# Patient Record
Sex: Female | Born: 1945 | Race: White | Hispanic: No | Marital: Single | Smoking: Current every day smoker
Health system: Southern US, Community
[De-identification: ages and names within clinical notes are randomized; demographics above are authoritative.]

## PROBLEM LIST (undated history)

## (undated) DIAGNOSIS — E785 Hyperlipidemia, unspecified: Secondary | ICD-10-CM

## (undated) DIAGNOSIS — J449 Chronic obstructive pulmonary disease, unspecified: Secondary | ICD-10-CM

## (undated) DIAGNOSIS — I219 Acute myocardial infarction, unspecified: Secondary | ICD-10-CM

## (undated) DIAGNOSIS — E559 Vitamin D deficiency, unspecified: Secondary | ICD-10-CM

## (undated) DIAGNOSIS — M1990 Unspecified osteoarthritis, unspecified site: Secondary | ICD-10-CM

## (undated) DIAGNOSIS — R5383 Other fatigue: Secondary | ICD-10-CM

## (undated) DIAGNOSIS — G459 Transient cerebral ischemic attack, unspecified: Secondary | ICD-10-CM

## (undated) DIAGNOSIS — R7303 Prediabetes: Secondary | ICD-10-CM

## (undated) DIAGNOSIS — I10 Essential (primary) hypertension: Secondary | ICD-10-CM

## (undated) DIAGNOSIS — I639 Cerebral infarction, unspecified: Secondary | ICD-10-CM

## (undated) DIAGNOSIS — E039 Hypothyroidism, unspecified: Secondary | ICD-10-CM

## (undated) HISTORY — DX: Hypothyroidism, unspecified: E03.9

## (undated) HISTORY — DX: Prediabetes: R73.03

## (undated) HISTORY — DX: Vitamin D deficiency, unspecified: E55.9

## (undated) HISTORY — DX: Unspecified osteoarthritis, unspecified site: M19.90

## (undated) HISTORY — DX: Chronic obstructive pulmonary disease, unspecified: J44.9

## (undated) HISTORY — DX: Essential (primary) hypertension: I10

## (undated) HISTORY — DX: Cerebral infarction, unspecified: I63.9

## (undated) HISTORY — DX: Hyperlipidemia, unspecified: E78.5

## (undated) HISTORY — DX: Transient cerebral ischemic attack, unspecified: G45.9

## (undated) HISTORY — DX: Other fatigue: R53.83

## (undated) HISTORY — DX: Acute myocardial infarction, unspecified: I21.9

---

## 2019-10-02 HISTORY — PX: OTHER SURGICAL HISTORY: SHX169

## 2019-11-06 ENCOUNTER — Encounter: Payer: Self-pay | Admitting: Neurology

## 2019-11-06 ENCOUNTER — Ambulatory Visit: Payer: Medicare PPO | Admitting: Neurology

## 2019-11-06 VITALS — BP 135/85 | HR 105 | Ht 62.0 in | Wt 92.0 lb

## 2019-11-06 DIAGNOSIS — G4452 New daily persistent headache (NDPH): Secondary | ICD-10-CM

## 2019-11-06 DIAGNOSIS — G9600 Cerebrospinal fluid leak, unspecified: Secondary | ICD-10-CM

## 2019-11-06 DIAGNOSIS — M5412 Radiculopathy, cervical region: Secondary | ICD-10-CM

## 2019-11-06 DIAGNOSIS — G9602 Spinal cerebrospinal fluid leak, spontaneous: Secondary | ICD-10-CM

## 2019-11-06 DIAGNOSIS — M542 Cervicalgia: Secondary | ICD-10-CM | POA: Insufficient documentation

## 2019-11-06 DIAGNOSIS — M7918 Myalgia, other site: Secondary | ICD-10-CM | POA: Diagnosis not present

## 2019-11-06 DIAGNOSIS — R1310 Dysphagia, unspecified: Secondary | ICD-10-CM

## 2019-11-06 DIAGNOSIS — G8929 Other chronic pain: Secondary | ICD-10-CM | POA: Insufficient documentation

## 2019-11-06 DIAGNOSIS — M5481 Occipital neuralgia: Secondary | ICD-10-CM | POA: Diagnosis not present

## 2019-11-06 DIAGNOSIS — R51 Headache with orthostatic component, not elsewhere classified: Secondary | ICD-10-CM

## 2019-11-06 MED ORDER — GABAPENTIN 100 MG PO CAPS: 100.0000 mg | ORAL_CAPSULE | Freq: Three times a day (TID) | ORAL | 6 refills | Status: DC

## 2019-11-06 NOTE — Progress Notes (Signed)
[facility]    Provider:  Dr Jaynee Eagles Requesting Provider: Art Buff, [place] Primary Care Provider:  Donald Prose, MD  CC:  headache  HPI:  Hailey Moody is a 74 y.o. female here as requested by Art Buff, [place] for headache. PMHx "mini stroke", hypohtyroidism, HLD, heart attack, fatigue, HTN, COPD, borderline diabetes, arthritis. She is here with her daughter. She has had a temporal artery biopsy. Started May 23rd, she started with a slight headache and she never get a headache no history of migraines or headaches. Since then she started having worsening headaches, 2 days later her headache was so bad she had to go to the clinic and she was in severe pain, positional quality better when laying down and worse throughout the day. Not resolved when laying down. It has never gone away completely. It feels like and electrode, shoots up to the top of the head and starts from the occipital area and shoot up. Pounding, pulsating, throbbing, continuous 24x7, waxes and wanes, she has a lot of neck pain and soreness in her shoulders. Also having significant neck and right shoulder pain. She was seen by rheumatology and had an evaluation. Some light sensitivity but only when the headache is severe, no inciting events, no falls, no new medications before it started. Prior to this very active. Daughter proides much information as well, she was quite well prior to this one day. Steroids did not help.  Headaches reported as severe and worsening.  Patient also reports difficulty swallowing, dysphagia, needs a swallow study.  No other focal neurologic deficits, associated symptoms, inciting events or modifiable factors.  Reviewed notes, labs and imaging from outside physicians, which showed:  I reviewed over 100 pages of records. Pertinent findings include: Patient was seen by her primary care physician and I reviewed those records: Who referred her to rheumatology, DEXA scan showed osteoporosis  she was started on Fosamax, chronic conditions include hypothyroidism, essential hypertension, hyperlipidemia, fatigue and vitamin D deficiency and new daily persistent headache patient reported acute onset daily headache, patient had a biopsy of the left temporal artery, biopsy was negative, patient continued to have daily headaches, she had been to the emergency room due to headache and elevated blood pressure, patient had multiple CTs of the head which were normal, had ultrasound of the arteries in the neck that she reported was 69% blocked, headaches reported as severe.  Per her primary care, blood pressure seems to be fairly well controlled most of the time, patient also had a complete cardiac work-up and she was also sent back to cardiology for evaluation, she does have chronic right shoulder pain and osteoarthritis.  I reviewed CT of the head report (we will request CD): September 11, 2019: No acute intracerebral hemorrhage, subarachnoid bleed or subdural hematoma noted, overall appearance and essentially stable and unchanged accounting for technical variance from a prior MRI, there is at best minimal central atrophic changes.  I reviewed records from [facility] in [place]:  Bone densitometry: October 21, 2019, BMD of the total lumbar spine, L1-L4 measures 0.644 g/cm with a T score of -3.7 and a D Z score of -1.2 considered within the range of osteoporosis.  BMD of the left femoral neck measures 0.520 g/cm with a T score of -3 and a Z score of -0.9 this is considered the range of osteoporosis.  Increased risk of fracture.  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  They recommended local wound care to left temporal incision  site, frontal lesion removed appears to be a possible basal cell cancer lesion.  Reviewed rheumatologic testing that was normal including antidouble-stranded DNA IgG, anti-Smith IgG, CD4 erythrocyte bound and B lymphocyte bound, ANA IgG, SSA, SSB, SCL 70, centromere antibodies IgG, Jo 1 antibodies, CCP antibodies, RF, IgM, RF IgA, CCP, IgG, anticardiolipin IgM and IgG, beta-2 glycoprotein antibodies IgM and IgG, antithyroglobulin IgG.  She had CRP elevation and ERC elevation, CK normal, hepatitis C core antibodies negative, hepatitis B surface antigen negative, hepatitis total core antibody is negative, TB Gold negative, normal vitamin D.  She did have a positive ANA antibody 01/15/2019 but notes state rheumatology evaluation at a later date ANA negative (see above).  Noted +Bouchard nodes, +Heberden's nodes, stiffness with active range of motion in the shoulders.  She noted little benefit with 14 days of steroids 40m.  She did have positive antithyroid peroxidase IgG.   I reviewed the lab results provided and documented them here: ESR 81, CRP 50, CK 52, CMP unremarkable, CBC unremarkable, RF less than 10 vitamin D 25-hydroxy 56.7, CCP antibodies normal, antidouble-stranded DNA IgG 27 within normal limits, anti-Smith IgG less than 1 within normal limits, erythrocyte bound CD4 and beta lymphocyte bound CD4 both within normal limits, ANA IgG +20 units, following lab tests normal: Anti-SSB, anti-SCL 70, anticentromere protein B, anti-Jo 1, anti-CCP, ANA titer was 1-3 20 which was positive and speckled, antiphospholipid syndrome and auto antibodies were all within normal limits, antithyroglobulin IgG was positive at 105, antithyroid peroxidase IgG was within normal limits, antihistone IgG was normal.  The following labs are all within normal limits anti-C1q IgG, antiribosomal IgG, antiphosphatidyl prothrombin IgM and IgG, anticardiolipin IgM/IgG/IgA, antibeta-2 glycoprotein IgM/IgG/IgA.  (collected September 09, 2019)   Review of Systems: Patient complains of symptoms per HPI as well as the following symptoms: headache, chronic neck and back pain. Pertinent negatives and positives per HPI. All others negative.   Social History   Socioeconomic History  . Marital status: Single    Spouse name: Not on file  . Number of children: Not on file  . Years of education: Not on file  . Highest education level: Not on file  Occupational History  . Not on file  Tobacco Use  . Smoking status: Current Every Day Smoker    Packs/day: 0.50    Types: Cigarettes  . Smokeless tobacco: Never Used  Substance and Sexual Activity  . Alcohol use: Not Currently  . Drug use: Never  . Sexual activity: Not on file  Other Topics Concern  . Not on file  Social History Narrative   [place] daughter due to medical reasons   Right handed   Caffeine: about 2 cups/day lately, normally 5.   Social Determinants of Health   Financial Resource Strain:   . Difficulty of Paying Living Expenses:   Food Insecurity:   . Worried About RCharity fundraiserin the Last Year:   . RArboriculturistin the Last Year:   Transportation Needs:   . LFilm/video editor(Medical):   .Marland KitchenLack of Transportation (Non-Medical):   Physical Activity:   . Days of Exercise per Week:   . Minutes of  Exercise per Session:   Stress:   . Feeling of Stress :   Social Connections:   . Frequency of Communication with Friends and Family:   . Frequency of Social Gatherings with Friends and Family:   . Attends Religious Services:   . Active Member of Clubs or Organizations:   . Attends Archivist Meetings:   Marland Kitchen Marital Status:   Intimate Partner Violence:   . Fear of Current or Ex-Partner:   . Emotionally Abused:   Marland Kitchen Physically Abused:   . Sexually Abused:     Family History  Problem Relation Age of Onset  . Diabetes type I Child   . Stroke Brother   . Heart attack  Mother   . Heart attack Father     Past Medical History:  Diagnosis Date  . Arthritis   . Borderline diabetes   . COPD (chronic obstructive pulmonary disease) (HCC)    on spiriva, nebulizer  . Essential hypertension   . Fatigue    unspecified  . Heart attack ([place])    "mild"  . Hyperlipidemia   . Hypothyroidism   . Mini stroke ([place])   . Vitamin D deficiency     Patient Active Problem List   Diagnosis Date Noted  . New daily persistent headache 11/06/2019  . Dysphagia 11/06/2019  . Cervical radiculopathy 11/06/2019  . Cervico-occipital neuralgia 11/06/2019  . Cervicalgia 11/06/2019  . Chronic neck pain 11/06/2019    Past Surgical History:  Procedure Laterality Date  . temporal artery biopsy Left 10/02/2019    Current Outpatient Medications  Medication Sig Dispense Refill  . albuterol (ACCUNEB) 1.25 MG/3ML nebulizer solution Take 3 mLs by nebulization. Three times per day as needed.    . Albuterol Sulfate (PROAIR HFA IN) Inhale 2 puffs into the lungs. Every 4-6 hours as needed.    Marland Kitchen amLODipine (NORVASC) 5 MG tablet Take 5 mg by mouth daily as needed.    . Calcium Carbonate-Vitamin D3 (CALCIUM 600/VITAMIN D) 600-400 MG-UNIT TABS Take 1 tablet by mouth in the morning and at bedtime.    . ergocalciferol (VITAMIN D2) 1.25 MG (50000 UT) capsule Take 50,000 Units by mouth once a week. On Tuesdays    . HYDROcodone-acetaminophen (NORCO/VICODIN) 5-325 MG tablet Take 1 tablet by mouth every 8 (eight) hours.     Marland Kitchen levothyroxine (SYNTHROID) 125 MCG tablet Take 125 mcg by mouth daily.    Marland Kitchen losartan (COZAAR) 100 MG tablet Take 100 mg by mouth daily.    . metoprolol tartrate (LOPRESSOR) 25 MG tablet Take 25 mg by mouth daily.    Marland Kitchen omeprazole (PRILOSEC) 20 MG capsule Take 20 mg by mouth daily.    . sertraline (ZOLOFT) 100 MG tablet Take 100 mg by mouth daily.     Marland Kitchen SPIRIVA HANDIHALER 18 MCG inhalation capsule Place 1 capsule into inhaler and inhale daily.    Marland Kitchen gabapentin (NEURONTIN)  100 MG capsule Take 1 capsule (100 mg total) by mouth 3 (three) times daily. 90 capsule 6   No current facility-administered medications for this visit.    Allergies as of 11/06/2019 - Review Complete 11/06/2019  Allergen Reaction Noted  . Penicillins  11/06/2019    Vitals: BP 135/85 (BP Location: Left Arm, Patient Position: Sitting, Cuff Size: Small)   Pulse (!) 105   Ht 5' 2"  (1.575 m)   Wt 92 lb (41.7 kg)   BMI 16.83 kg/m  Last Weight:  Wt Readings from Last 1 Encounters:  11/06/19 92 lb (41.7  kg)   Last Height:   Ht Readings from Last 1 Encounters:  11/06/19 5' 2"  (1.575 m)     Physical exam: Exam: Gen: NAD, conversant, thin and frail                   CV: RRR, no MRG. No Carotid Bruits. No peripheral edema, warm, nontender Eyes: Conjunctivae clear without exudates or hemorrhage  Neuro: Detailed Neurologic Exam  Speech:    Speech is normal; fluent and spontaneous with normal comprehension.  Cognition:    The patient is oriented to person, place, and time;     recent and remote memory intact;     language fluent;     normal attention, concentration,     fund of knowledge Cranial Nerves:    The pupils are equal, round, and reactive to light.attempted fundoscpy could not visualize due to small pupils. Visual fields are full to finger confrontation. Extraocular movements are intact. Trigeminal sensation is intact and the muscles of mastication are normal. The face is symmetric. The palate elevates in the midline. Hearing intact. Voice is normal. Shoulder shrug is normal. The tongue has normal motion without fasciculations.   Coordination:    No ataxia or dysmetria  Gait:    Normal native gait  Motor Observation:    No asymmetry, no atrophy, and no involuntary movements noted. Tone:    Normal muscle tone.    Posture:    Posture is normal. normal erect    Strength: Limited by pain in joints however no focal deficits noted, V/V in the upper and lower limbs.       Sensation: intact to LT     Reflex Exam:  DTR's:    Deep tendon reflexes in the upper and lower extremities are brisk bilaterally.   Toes:    The toes are downgoing bilaterally.   Clonus:    Clonus is absent.    Assessment/Plan:  Very interesting case of a 74 year old female with no history of headaches with new onset daly persistent headache. In situations such as this especially since she has a positional quality we worry about spontaneous CSF leak and she does have many of those symptoms including positional quality, neck pain and soreness. She had a temporal artery biopsy and steroids did not help so that makes GCA or PMR very unlikely. She has neck pain and quality is radiating from the occipital area so this may be occipital nerve irritation/cervicalgia.  An occipital block helped tremendously with neck pain and improved the headache. This makes cervicogenic headache most likely. However given acute onset and positional quality with continuous pain for months we really need to evaluate for spontaneous csf leak and low csf pressure headache. Spent extended time with patient and daughter as well as reviewing approximately 100 pages of records.   - Need MRI brain and cervical spine for csf leak, headache, chronic neck pain, dysphagia, cervico-occipital pain, cervical radiculopathy, new daily persistent refractory headache, positional headache. - May consider a blood patch and referral to [facility] if needed - Blood work today - Bilateral occipital nerve blocks - Physical therapy with dry needling for occipital neuralgia/cervicalgia: Physical therapy and also Please include dry needling cervcal spine for cervico-occipital neuralgia and cervicalgia as well as for myofascial pain syndrome.  She also has right shoulder pain and hopefully we can incorporate this into physical therapy as well. - Dysphagia hoarse voice, feeling choked: Swallow study and MRI of the cervical spine - Gabapentin  prn  Performed by Dr. Jaynee Eagles M.D. 30-gauge needle was used. All procedures a documented below were medically necessary, reasonable and appropriate based on the patient's history, medical diagnosis and physician opinion. Verbal informed consent was obtained from the patient, patient was informed of potential risk of procedure, including bruising, bleeding, hematoma formation, infection, muscle weakness, muscle pain, numbness, transient hypertension, transient hyperglycemia and transient insomnia among others. All areas injected were topically clean with isopropyl rubbing alcohol. Nonsterile nonlatex gloves were worn during the procedure.  1. Greater occipital nerve block [phone]). The greater occipital nerve site was identified at the nuchal line medial to the occipital artery. Medication was injected into the left and right occipital nerve areas and suboccipital areas. Patient's condition is associated with inflammation of the greater occipital nerve and associated multiple groups. Injection was deemed medically necessary, reasonable and appropriate. Injection represents a separate and unique surgical service.   Orders Placed This Encounter  Procedures  . MR BRAIN W WO CONTRAST  . MR CERVICAL SPINE W WO CONTRAST  . DG OP Swallowing Func-Medicare/Speech Path  . Sedimentation rate  . C-reactive protein  . TSH  . Comprehensive metabolic panel  . CBC  . T4, Free  . Ambulatory referral to Physical Therapy  . SLP modified barium swallow   Meds ordered this encounter  Medications  . gabapentin (NEURONTIN) 100 MG capsule    Sig: Take 1 capsule (100 mg total) by mouth 3 (three) times daily.    Dispense:  90 capsule    Refill:  6    Cc: Art Buff, FNP,  Donald Prose, MD  Sarina Ill, MD  [facility] [address] [place], [place] [address]  Phone [phone] Fax [phone]  I spent more than 90 minutes of face-to-face and non-face-to-face time  with patient on the  1. New daily persistent headache   2. Chronic neck pain   3. Myofascial pain syndrome, cervical   4. Cervicalgia   5. Cervico-occipital neuralgia   6. Cerebrospinal fluid leak   7. CSF leak   8. 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 Cervical radiculopathy   10. Positional headache   11. Headache due to low cerebrospinal fluid pressure   12. NDPH (new daily persistent headache)   13. Dysphagia, unspecified type    diagnosis.  This included previsit chart review, lab review, study review, order entry, electronic health record documentation, patient education on the different diagnostic and therapeutic options, counseling and coordination of care, risks and benefits of management, compliance, or risk factor reduction. This does not include time spent on nerve block procedure.

## 2019-11-06 NOTE — Progress Notes (Signed)
Nerve block w/o steroid: Pt signed consent  0.5% Bupivocaine 3 mL LOT: [id number] EXP: 03/2022 NDC: 12244-975-30  2% Lidocaine 3 mL LOT: [id number] EXP: 02/26/20 NDC: 0511-0211-17

## 2019-11-06 NOTE — Patient Instructions (Addendum)
MRI of the brain and cervical spine Blood work today Gabapentin up to 3x daily  Gabapentin capsules or tablets What is this medicine? GABAPENTIN (GA ba pen tin) is used to control seizures in certain types of epilepsy. It is also used to treat certain types of nerve pain. This medicine may be used for other purposes; ask your health care provider or pharmacist if you have questions. COMMON BRAND NAME(S): Active-PAC with Gabapentin, Gabarone, Neurontin What should I tell my health care provider before I take this medicine? They need to know if you have any of these conditions:  history of drug abuse or alcohol abuse problem  kidney disease  lung or breathing disease  suicidal thoughts, plans, or attempt; a previous suicide attempt by you or a family member  an unusual or allergic reaction to gabapentin, other medicines, foods, dyes, or preservatives  pregnant or trying to get pregnant  breast-feeding How should I use this medicine? Take this medicine by mouth with a glass of water. Follow the directions on the prescription label. You can take it with or without food. If it upsets your stomach, take it with food. Take your medicine at regular intervals. Do not take it more often than directed. Do not stop taking except on your doctor's advice. If you are directed to break the 600 or 800 mg tablets in half as part of your dose, the extra half tablet should be used for the next dose. If you have not used the extra half tablet within 28 days, it should be thrown away. A special MedGuide will be given to you by the pharmacist with each prescription and refill. Be sure to read this information carefully each time. Talk to your pediatrician regarding the use of this medicine in children. While this drug may be prescribed for children as young as 3 years for selected conditions, precautions do apply. Overdosage: If you think you have taken too much of this medicine contact a poison control  center or emergency room at once. NOTE: This medicine is only for you. Do not share this medicine with others. What if I miss a dose? If you miss a dose, take it as soon as you can. If it is almost time for your next dose, take only that dose. Do not take double or extra doses. What may interact with this medicine? This medicine may interact with the following medications:  alcohol  antihistamines for allergy, cough, and cold  certain medicines for anxiety or sleep  certain medicines for depression like amitriptyline, fluoxetine, sertraline  certain medicines for seizures like phenobarbital, primidone  certain medicines for stomach problems  general anesthetics like halothane, isoflurane, methoxyflurane, propofol  local anesthetics like lidocaine, pramoxine, tetracaine  medicines that relax muscles for surgery  narcotic medicines for pain  phenothiazines like chlorpromazine, mesoridazine, prochlorperazine, thioridazine This list may not describe all possible interactions. Give your health care provider a list of all the medicines, herbs, non-prescription drugs, or dietary supplements you use. Also tell them if you smoke, drink alcohol, or use illegal drugs. Some items may interact with your medicine. What should I watch for while using this medicine? Visit your doctor or health care provider for regular checks on your progress. You may want to keep a record at home of how you feel your condition is responding to treatment. You may want to share this information with your doctor or health care provider at each visit. You should contact your doctor or health care provider if your  seizures get worse or if you have any new types of seizures. Do not stop taking this medicine or any of your seizure medicines unless instructed by your doctor or health care provider. Stopping your medicine suddenly can increase your seizures or their severity. This medicine may cause serious skin reactions.  They can happen weeks to months after starting the medicine. Contact your health care provider right away if you notice fevers or flu-like symptoms with a rash. The rash may be red or purple and then turn into blisters or peeling of the skin. Or, you might notice a red rash with swelling of the face, lips or lymph nodes in your neck or under your arms. Wear a medical identification bracelet or chain if you are taking this medicine for seizures, and carry a card that lists all your medications. You may get drowsy, dizzy, or have blurred vision. Do not drive, use machinery, or do anything that needs mental alertness until you know how this medicine affects you. To reduce dizzy or fainting spells, do not sit or stand up quickly, especially if you are an older patient. Alcohol can increase drowsiness and dizziness. Avoid alcoholic drinks. Your mouth may get dry. Chewing sugarless gum or sucking hard candy, and drinking plenty of water will help. The use of this medicine may increase the chance of suicidal thoughts or actions. Pay special attention to how you are responding while on this medicine. Any worsening of mood, or thoughts of suicide or dying should be reported to your health care provider right away. Women who become pregnant while using this medicine may enroll in the [place] [employer] Antiepileptic Drug Pregnancy Registry by calling [phone]. This registry collects information about the safety of antiepileptic drug use during pregnancy. What side effects may I notice from receiving this medicine? Side effects that you should report to your doctor or health care professional as soon as possible:  allergic reactions like skin rash, itching or hives, swelling of the face, lips, or tongue  breathing problems  rash, fever, and swollen lymph nodes  redness, blistering, peeling or loosening of the skin, including inside the mouth  suicidal thoughts, mood changes Side effects that usually do not  require medical attention (report to your doctor or health care professional if they continue or are bothersome):  dizziness  drowsiness  headache  nausea, vomiting  swelling of ankles, feet, hands  tiredness This list may not describe all possible side effects. Call your doctor for medical advice about side effects. You may report side effects to FDA at 1-800-FDA-1088. Where should I keep my medicine? Keep out of reach of children. This medicine may cause accidental overdose and death if it taken by other adults, children, or pets. Mix any unused medicine with a substance like cat litter or coffee grounds. Then throw the medicine away in a sealed container like a sealed bag or a coffee can with a lid. Do not use the medicine after the expiration date. Store at room temperature between 15 and 30 degrees C (59 and 86 degrees F). NOTE: This sheet is a summary. It may not cover all possible information. If you have questions about this medicine, talk to your doctor, pharmacist, or health care provider.  2020 Elsevier/Gold Standard (2018-06-15 14:16:43)    Occipital Neuralgia  Occipital neuralgia is a type of headache that causes brief episodes of very bad pain in the back of your head. Pain from occipital neuralgia may spread (radiate) to other parts of your head.  These headaches may be caused by irritation of the nerves that leave your spinal cord high up in your neck, just below the base of your skull (occipital nerves). Your occipital nerves transmit sensations from the back of your head, the top of your head, and the areas behind your ears. What are the causes? This condition can occur without any known cause (primary headache syndrome). In other cases, this condition is caused by pressure on or irritation of one of the two occipital nerves. Pressure and irritation may be due to:  Muscle spasm in the neck.  Neck injury.  Wear and tear of the vertebrae in the neck  (osteoarthritis).  Disease of the disks that separate the vertebrae.  Swollen blood vessels that put pressure on the occipital nerves.  Infections.  Tumors.  Diabetes. What are the signs or symptoms? This condition causes brief burning, stabbing, electric, shocking, or shooting pain which can radiate to the top of the head. It can happen on one side or both sides of the head. It can also cause:  Pain behind the eye.  Pain triggered by neck movement or hair brushing.  Scalp tenderness.  Aching in the back of the head between episodes of very bad pain.  Pain gets worse with exposure to bright lights. How is this diagnosed? There is no test that diagnoses this condition. Your health care provider may diagnose this condition based on a physical exam and your symptoms. Other tests may be done, such as:  Imaging studies of the brain and neck (cervical spine), such as an MRI or CT scan. These look for causes of pinched nerves.  Applying pressure to the nerves in the neck to try to re-create the pain.  Injection of numbing medicine into the occipital nerve areas to see if pain goes away (diagnostic nerve block). How is this treated? Treatment for this condition may begin with simple measures, such as:  Rest.  Massage.  Applying heat or cold on the area.  Over-the-counter pain relievers. If these measures do not work, you may need other treatments, including:  Medicines, such as: ? Prescription-strength anti-inflammatory medicines. ? Muscle relaxants. ? Anti-seizure medicines, which can relieve pain. ? Antidepressants, which can relieve pain. ? Injected medicines, such as medicines that numb the area (local anesthetic) and steroids.  Pulsed radiofrequency ablation. This is when wires are implanted to deliver electrical impulses that block pain signals from the occipital nerve.  Surgery to relieve nerve pressure.  Physical therapy. Follow these instructions at home: Pain  management      Avoid any activities that cause pain.  Rest when you have an attack of pain.  Try gentle massage to relieve pain.  Try a different pillow or sleeping position.  If directed, apply heat to the affected area as told by your health care provider. Use the heat source that your health care provider recommends, such as a moist heat pack or a heating pad. ? Place a towel between your skin and the heat source. ? Leave the heat on for 20-30 minutes. ? Remove the heat if your skin turns bright red. This is especially important if you are unable to feel pain, heat, or cold. You may have a greater risk of getting burned.  If directed, apply ice to the back of the head and neck area as told by your health care provider. ? Put ice in a plastic bag. ? Place a towel between your skin and the bag. ? Leave the ice on  for 20 minutes, 2-3 times per day. General instructions  Take over-the-counter and prescription medicines only as told by your health care provider.  Avoid things that make your symptoms worse, such as bright lights.  Try to stay active. Get regular exercise that does not cause pain. Ask your health care provider to suggest safe exercises for you.  Work with a physical therapist to learn stretching exercises you can do at home.  Practice good posture.  Keep all follow-up visits as told by your health care provider. This is important. Contact a health care provider if:  Your medicine is not working.  You have new or worsening symptoms. Get help right away if:  You have very bad head pain that does not go away.  You have a sudden change in vision, balance, or speech. Summary  Occipital neuralgia is a type of headache that causes brief episodes of very bad pain in the back of your head.  Pain from occipital neuralgia may spread (radiate) to other parts of your head.  Treatment for this condition includes rest, massage, and medicines. This information is not  intended to replace advice given to you by your health care provider. Make sure you discuss any questions you have with your health care provider. Document Revised: 02/28/2017 Document Reviewed: 05/19/2016 Elsevier Patient Education  2020 Elsevier Inc.   Spinal Headache or csf leak headache  A spinal headache is a severe headache that can happen after a person has had a lumbar puncture or epidural anesthesia or can happen spontaneously. During these procedures, a needle is passed between the bones of the spine. The headache usually starts from a few hours to 1-2 days after that. The headache can last for a few days. In rare cases, it can last for more than a week. If it happens spontaneously is called spontaneous csf leak or low pressure headache.  What are the causes? This condition is caused by a leak of spinal fluid from the spine through the hole that is left by the needle. What are the signs or symptoms? Symptoms of this condition include:  A severe headache.  A headache that is worse when you sit or stand and better when you lie down.  Neck pain and stiffness, especially when tilting your chin toward your chest.  Nausea and vomiting. How is this diagnosed? This condition is usually diagnosed based on:  Your medical history.  Your symptoms.  A CT scan or MRI of the brain to help rule out other conditions. How is this treated? Treatment for this condition may include:  Replacing fluids that leaked out through the needle hole. Fluids may be replaced by: ? Drinking more fluids. ? Getting fluids through an IV line that is inserted into one of your veins.  Caffeine to help reduce your headache. Your health care provider may recommend drinking caffeinated beverages such as soda, coffee, or tea.  Having an epidural blood patch procedure. In this procedure, a small amount of your blood is injected into the area of the leak in order to seal it.  Medicines for pain.  Resting and  lying flat for a few days. Follow these instructions at home:      Take over-the-counter and prescription medicines only as told by your health care provider.  Drink enough fluids to keep your urine pale yellow.  Drink caffeinated beverages as told by your health care provider.  Lie down to relieve pain if your pain gets worse when you sit or stand.  Return to your normal activities as told by your health care provider. Ask your health care provider what activities are safe for you.  Keep all follow-up visits as told by your health care provider. This is important. Contact a health care provider if:  You develop nausea and vomiting. Get help right away if:  Your pain becomes very severe.  Your pain cannot be controlled.  You develop a fever.  You have a stiff neck.  You develop problems with your vision.  You lose control of your bowel or bladder (have incontinence).  You have trouble walking, you feel weak, or you lose feeling in part of your body. Summary  A spinal headache is a severe headache that can happen after a person has had a lumbar puncture or epidural anesthesia.  This condition is caused by a leak of spinal fluid from the spine through the hole that is left by the needle. The headache can last for a few days. In rare cases, it lasts for more than a week.  Supportive measures, such as drinking more fluid and taking pain medicines, are usually recommended. In some cases, it may be necessary to inject a small amount of your blood to seal the leak. This information is not intended to replace advice given to you by your health care provider. Make sure you discuss any questions you have with your health care provider. Document Revised: 04/27/2017 Document Reviewed: 04/26/2017 Elsevier Patient Education  2020 Elsevier Inc. Occipital Nerve Block Patient Information  Description: The occipital nerves originate in the cervical (neck) spinal cord and travel upward  through muscle and tissue to supply sensation to the back of the head and top of the scalp.  In addition, the nerves control some of the muscles of the scalp.  Occipital neuralgia is an irritation of these nerves which can cause headaches, numbness of the scalp, and neck discomfort.     The occipital nerve block will interrupt nerve transmission through these nerves and can relieve pain and spasm.  The block consists of insertion of a small needle under the skin in the back of the head to deposit local anesthetic (numbing medicine) and/or steroids around the nerve.  The entire block usually lasts less than 5 minutes.  Conditions which may be treated by occipital blocks:   Muscular pain and spasm of the scalp  Nerve irritation, back of the head  Headaches  Upper neck pain  Preparation for the injection:  6. Do not eat any solid food or dairy products within 8 hours of your appointment. 7. You may drink clear liquids up to 3 hours before appointment.  Clear liquids include water, black coffee, juice or soda.  No milk or cream please. 8. You may take your regular medication, including pain medications, with a sip of water before you appointment.  Diabetics should hold regular insulin (if taken separately) and take 1/2 normal NPH dose the morning of the procedure.  Carry some sugar containing items with you to your appointment. 9. A driver must accompany you and be prepared to drive you home after your procedure. 10. Bring all your current medications with you. 11. An IV may be inserted and sedation may be given at the discretion of the physician. 12. A blood pressure cuff, EKG, and other monitors will often be applied during the procedure.  Some patients may need to have extra oxygen administered for a short period. 13. You will be asked to provide medical information, including your allergies and  medications, prior to the procedure.  We must know immediately if you are taking blood thinners  (like Coumadin/Warfarin) or if you are allergic to IV iodine contrast (dye).  We must know if you could possible be pregnant.  14. Do not wear a high collared shirt or turtleneck.  Tie long hair up in the back if possible.  Possible side-effects:   Bleeding from needle site  Infection (rare, may require surgery)  Nerve injury (rare)  Hair on back of neck can be tinged with iodine scrub (this will wash out)  Light-headedness (temporary)  Pain at injection site (several days)  Decreased blood pressure (rare, temporary)  Seizure (very rare)  Call if you experience:   Hives or difficulty breathing ( go to the emergency room)  Inflammation or drainage at the injection site(s)  Please note:  Although the local anesthetic injected can often make your painful muscles or headache feel good for several hours after the injection, the pain may return.  It takes 3-7 days for steroids to work.  You may not notice any pain relief for at least one week.  If effective, we will often do a series of injections spaced 3-6 weeks apart to maximally decrease your pain.  If you have any questions, please call [phone] [facility]

## 2019-11-07 ENCOUNTER — Telehealth: Payer: Self-pay | Admitting: *Deleted

## 2019-11-07 ENCOUNTER — Telehealth: Payer: Self-pay | Admitting: Neurology

## 2019-11-07 LAB — COMPREHENSIVE METABOLIC PANEL
ALT: 11 IU/L (ref 0–32)
AST: 18 IU/L (ref 0–40)
Albumin/Globulin Ratio: 1.4 (ref 1.2–2.2)
Albumin: 4.1 g/dL (ref 3.7–4.7)
Alkaline Phosphatase: 124 IU/L — ABNORMAL HIGH (ref 48–121)
BUN/Creatinine Ratio: 18 (ref 12–28)
BUN: 16 mg/dL (ref 8–27)
Bilirubin Total: 0.4 mg/dL (ref 0.0–1.2)
CO2: 26 mmol/L (ref 20–29)
Calcium: 10.1 mg/dL (ref 8.7–10.3)
Chloride: 88 mmol/L — ABNORMAL LOW (ref 96–106)
Creatinine, Ser: 0.87 mg/dL (ref 0.57–1.00)
GFR calc Af Amer: 75 mL/min/{1.73_m2} (ref >59)
GFR calc non Af Amer: 65 mL/min/{1.73_m2} (ref >59)
Globulin, Total: 3 g/dL (ref 1.5–4.5)
Glucose: 87 mg/dL (ref 65–99)
Potassium: 4.3 mmol/L (ref 3.5–5.2)
Sodium: 129 mmol/L — ABNORMAL LOW (ref 134–144)
Total Protein: 7.1 g/dL (ref 6.0–8.5)

## 2019-11-07 LAB — T4, FREE: Free T4: 1.46 ng/dL (ref 0.82–1.77)

## 2019-11-07 LAB — TSH: TSH: 20.8 u[IU]/mL — ABNORMAL HIGH (ref 0.450–4.500)

## 2019-11-07 LAB — CBC
Hematocrit: 39 % (ref 34.0–46.6)
Hemoglobin: 13.2 g/dL (ref 11.1–15.9)
MCH: 28.4 pg (ref 26.6–33.0)
MCHC: 33.8 g/dL (ref 31.5–35.7)
MCV: 84 fL (ref 79–97)
Platelets: 486 10*3/uL — ABNORMAL HIGH (ref 150–450)
RBC: 4.65 x10E6/uL (ref 3.77–5.28)
RDW: 14.5 % (ref 11.7–15.4)
WBC: 9.5 10*3/uL (ref 3.4–10.8)

## 2019-11-07 LAB — C-REACTIVE PROTEIN: CRP: 145 mg/L — ABNORMAL HIGH (ref 0–10)

## 2019-11-07 LAB — SEDIMENTATION RATE: Sed Rate: 25 mm/hr (ref 0–40)

## 2019-11-07 NOTE — Telephone Encounter (Signed)
I spoke with the patient and discussed her lab results with her per Dr. Lucia Gaskins. Her questions were answered. She was advised to follow with PCP for thyroid and sodium level. Pt aware labs were sent to PCP. She verbalized appreciation for the call.

## 2019-11-07 NOTE — Telephone Encounter (Signed)
[employer] pending  

## 2019-11-07 NOTE — Progress Notes (Signed)
Free T4 value is normal. So it appears her thyroid medication is working but I would continue to follow  up with primary care. Her Sodium is very low at 129 but it was like that on prior labs too (09/09/2019 was also 129) I would monitor it with primary care and have them evaluate that. I have forwarded labs to Dr. Wynelle Link. thanks

## 2019-11-07 NOTE — Telephone Encounter (Signed)
-----   Message from Anson Fret, MD sent at 11/07/2019 10:37 AM EDT ----- Free T4 value is normal. So it appears her thyroid medication is working but I would continue to follow  up with primary care. Her Sodium is very low at 129 but it was like that on prior labs too (09/09/2019 was also 129) I would monitor it with primary care and have them evaluate that. I have forwarded labs to Dr. Wynelle Link. thanks

## 2019-11-11 ENCOUNTER — Telehealth: Payer: Self-pay | Admitting: Neurology

## 2019-11-11 NOTE — Telephone Encounter (Signed)
Francine Graven Berkley Harvey: [id number] (exp. 11/07/19 to 12/07/19) order sent to GI. They will reach out to the patient to schedule.

## 2019-11-11 NOTE — Telephone Encounter (Signed)
Pt's daughter, Milagros Evener ask if Pt can be worked in for an injection. Mr. Zachery Dauer said Dr. Lucia Gaskins told her at visit last week if she needed an injection to call early enough and would work Pt in. Ms. Zachery Dauer would like a call from the nurse.

## 2019-11-11 NOTE — Telephone Encounter (Signed)
Spoke with Dr Lucia Gaskins. We have an 8:00 AM availability tomorrow 11/12/19. Please offer this appointment, thank you!

## 2019-11-12 ENCOUNTER — Ambulatory Visit: Payer: Medicare PPO | Admitting: Neurology

## 2019-11-12 VITALS — BP 146/89 | HR 101

## 2019-11-12 DIAGNOSIS — M5412 Radiculopathy, cervical region: Secondary | ICD-10-CM | POA: Diagnosis not present

## 2019-11-12 DIAGNOSIS — R519 Headache, unspecified: Secondary | ICD-10-CM

## 2019-11-12 DIAGNOSIS — M5481 Occipital neuralgia: Secondary | ICD-10-CM | POA: Diagnosis not present

## 2019-11-12 MED ORDER — METHYLPREDNISOLONE ACETATE 80 MG/ML IJ SUSP
80.0000 mg | Freq: Once | INTRAMUSCULAR | Status: AC
  Administered 2019-11-12: 80 mg via INTRAMUSCULAR

## 2019-11-12 MED ORDER — GABAPENTIN 300 MG PO CAPS: 300.0000 mg | ORAL_CAPSULE | Freq: Three times a day (TID) | ORAL | 6 refills | Status: DC

## 2019-11-12 NOTE — Progress Notes (Signed)
Patient here for repeat nerve block, occipital. An occipital block last week helped tremendously with neck pain and improved the occipital headache. 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  Referral to Dr. Naaman Plummer for cervical ESI, medial branch blocks or occipital RFA as per his clinical evauation. Also increase gabapentin.  Performed by Dr. Lucia Gaskins M.D. . All procedures a documented blood were medically necessary, reasonable and appropriate based on the patient's history, medical diagnosis and physician opinion. Verbal informed consent was obtained from the patient, patient was informed of potential risk of procedure, including bruising, bleeding, hematoma formation, infection, muscle weakness, muscle pain, numbness, transient hypertension, transient hyperglycemia and transient insomnia among others. All areas injected were topically clean with isopropyl rubbing alcohol. Nonsterile nonlatex gloves were worn during the procedure.  1. Greater occipital nerve block [phone]). The greater occipital nerve site was identified at the nuchal line medial to the occipital artery. Medication was injected into the left and right occipital nerve areas and suboccipital areas. Patient's condition is associated with inflammation of the greater occipital nerve and associated multiple groups. Injection was deemed medically necessary, reasonable and appropriate. Injection represents a separate and unique surgical service.  1. Lesser occipital nerve block (64450). The lesser occipital nerve site was identified approximately 2 cm lateral to the greater occipital nerve. Occasion was injected into the left and right occipital nerve areas. Patient's condition is associated  with inflammation of the lesser occipital nerve and associated muscle groups. Injection was deemed medically necessary, reasonable and appropriate. Injection represents a separate and unique surgical service.  2. Supraorbital nerve block (64400): Supraorbital nerve site was identified along the incision of the frontal bone on the orbital/supraorbital ridge. Medication was injected into the left supraorbital nerve areas. Patient's condition is associated with inflammation of the supraorbital and associated muscle groups. Injection was deemed medically necessary, reasonable and appropriate. Injection represents a separate and unique surgical service.

## 2019-11-12 NOTE — Progress Notes (Signed)
Nerve block w/ steroid: Pt signed consent  0.5% Bupivocaine 4 mL LOT: [id number] EXP: 03/2022 NDC: 51700-174-94  2% Lidocaine 4 mL LOT: [id number] EXP: 02/26/20 NDC: 4967-5916-38  Depomedrol 80 mg/mL x 1  See MAR

## 2019-11-12 NOTE — Patient Instructions (Signed)
Dr. Naaman Plummer for further procedures MRI brain and cervical spine Repeat nerve block Friday   Occipital Neuralgia  Occipital neuralgia is a type of headache that causes brief episodes of very bad pain in the back of your head. Pain from occipital neuralgia may spread (radiate) to other parts of your head. These headaches may be caused by irritation of the nerves that leave your spinal cord high up in your neck, just below the base of your skull (occipital nerves). Your occipital nerves transmit sensations from the back of your head, the top of your head, and the areas behind your ears. What are the causes? This condition can occur without any known cause (primary headache syndrome). In other cases, this condition is caused by pressure on or irritation of one of the two occipital nerves. Pressure and irritation may be due to:  Muscle spasm in the neck.  Neck injury.  Wear and tear of the vertebrae in the neck (osteoarthritis).  Disease of the disks that separate the vertebrae.  Swollen blood vessels that put pressure on the occipital nerves.  Infections.  Tumors.  Diabetes. What are the signs or symptoms? This condition causes brief burning, stabbing, electric, shocking, or shooting pain which can radiate to the top of the head. It can happen on one side or both sides of the head. It can also cause:  Pain behind the eye.  Pain triggered by neck movement or hair brushing.  Scalp tenderness.  Aching in the back of the head between episodes of very bad pain.  Pain gets worse with exposure to bright lights. How is this diagnosed? There is no test that diagnoses this condition. Your health care provider may diagnose this condition based on a physical exam and your symptoms. Other tests may be done, such as:  Imaging studies of the brain and neck (cervical spine), such as an MRI or CT scan. These look for causes of pinched nerves.  Applying pressure to the nerves in the neck to  try to re-create the pain.  Injection of numbing medicine into the occipital nerve areas to see if pain goes away (diagnostic nerve block). How is this treated? Treatment for this condition may begin with simple measures, such as:  Rest.  Massage.  Applying heat or cold on the area.  Over-the-counter pain relievers. If these measures do not work, you may need other treatments, including:  Medicines, such as: ? Prescription-strength anti-inflammatory medicines. ? Muscle relaxants. ? Anti-seizure medicines, which can relieve pain. ? Antidepressants, which can relieve pain. ? Injected medicines, such as medicines that numb the area (local anesthetic) and steroids.  Pulsed radiofrequency ablation. This is when wires are implanted to deliver electrical impulses that block pain signals from the occipital nerve.  Surgery to relieve nerve pressure.  Physical therapy. Follow these instructions at home: Pain management      Avoid any activities that cause pain.  Rest when you have an attack of pain.  Try gentle massage to relieve pain.  Try a different pillow or sleeping position.  If directed, apply heat to the affected area as told by your health care provider. Use the heat source that your health care provider recommends, such as a moist heat pack or a heating pad. ? Place a towel between your skin and the heat source. ? Leave the heat on for 20-30 minutes. ? Remove the heat if your skin turns bright red. This is especially important if you are unable to feel pain, heat, or  cold. You may have a greater risk of getting burned.  If directed, apply ice to the back of the head and neck area as told by your health care provider. ? Put ice in a plastic bag. ? Place a towel between your skin and the bag. ? Leave the ice on for 20 minutes, 2-3 times per day. General instructions  Take over-the-counter and prescription medicines only as told by your health care provider.  Avoid  things that make your symptoms worse, such as bright lights.  Try to stay active. Get regular exercise that does not cause pain. Ask your health care provider to suggest safe exercises for you.  Work with a physical therapist to learn stretching exercises you can do at home.  Practice good posture.  Keep all follow-up visits as told by your health care provider. This is important. Contact a health care provider if:  Your medicine is not working.  You have new or worsening symptoms. Get help right away if:  You have very bad head pain that does not go away.  You have a sudden change in vision, balance, or speech. Summary  Occipital neuralgia is a type of headache that causes brief episodes of very bad pain in the back of your head.  Pain from occipital neuralgia may spread (radiate) to other parts of your head.  Treatment for this condition includes rest, massage, and medicines. This information is not intended to replace advice given to you by your health care provider. Make sure you discuss any questions you have with your health care provider. Document Revised: 02/28/2017 Document Reviewed: 05/19/2016 [employer] Patient Education  2020 [employer] Occipital Nerve Block Patient Information  Description: The occipital nerves originate in the cervical (neck) spinal cord and travel upward through muscle and tissue to supply sensation to the back of the head and top of the scalp.  In addition, the nerves control some of the muscles of the scalp.  Occipital neuralgia is an irritation of these nerves which can cause headaches, numbness of the scalp, and neck discomfort.     The occipital nerve block will interrupt nerve transmission through these nerves and can relieve pain and spasm.  The block consists of insertion of a small needle under the skin in the back of the head to deposit local anesthetic (numbing medicine) and/or steroids around the nerve.  The entire block usually lasts  less than 5 minutes.  Conditions which may be treated by occipital blocks:   Muscular pain and spasm of the scalp  Nerve irritation, back of the head  Headaches  Upper neck pain  Preparation for the injection:  5. Do not eat any solid food or dairy products within 8 hours of your appointment. 6. You may drink clear liquids up to 3 hours before appointment.  Clear liquids include water, black coffee, juice or soda.  No milk or cream please. 7. You may take your regular medication, including pain medications, with a sip of water before you appointment.  Diabetics should hold regular insulin (if taken separately) and take 1/2 normal NPH dose the morning of the procedure.  Carry some sugar containing items with you to your appointment. 8. A driver must accompany you and be prepared to drive you home after your procedure. 9. Bring all your current medications with you. 10. An IV may be inserted and sedation may be given at the discretion of the physician. 11. A blood pressure cuff, EKG, and other monitors will often be applied  during the procedure.  Some patients may need to have extra oxygen administered for a short period. 12. You will be asked to provide medical information, including your allergies and medications, prior to the procedure.  We must know immediately if you are taking blood thinners (like Coumadin/Warfarin) or if you are allergic to IV iodine contrast (dye).  We must know if you could possible be pregnant.  13. Do not wear a high collared shirt or turtleneck.  Tie long hair up in the back if possible.  Possible side-effects:   Bleeding from needle site  Infection (rare, may require surgery)  Nerve injury (rare)  Hair on back of neck can be tinged with iodine scrub (this will wash out)  Light-headedness (temporary)  Pain at injection site (several days)  Decreased blood pressure (rare, temporary)  Seizure (very rare)  Call if you experience:   Hives or  difficulty breathing ( go to the emergency room)  Inflammation or drainage at the injection site(s)  Please note:  Although the local anesthetic injected can often make your painful muscles or headache feel good for several hours after the injection, the pain may return.  It takes 3-7 days for steroids to work.  You may not notice any pain relief for at least one week.  If effective, we will often do a series of injections spaced 3-6 weeks apart to maximally decrease your pain.  If you have any questions, please call [phone] [facility]

## 2019-11-13 ENCOUNTER — Ambulatory Visit (HOSPITAL_COMMUNITY)
Admission: RE | Admit: 2019-11-13 | Discharge: 2019-11-13 | Disposition: A | Discharge status: Home / Self Care | Payer: Medicare PPO | Source: Ambulatory Visit | Attending: Neurology | Admitting: Neurology

## 2019-11-13 ENCOUNTER — Other Ambulatory Visit: Payer: Self-pay | Admitting: Neurology

## 2019-11-13 DIAGNOSIS — R1310 Dysphagia, unspecified: Secondary | ICD-10-CM

## 2019-11-13 NOTE — Therapy (Signed)
Modified Barium Swallow Progress Note  Patient Details  Name: Hailey Moody MRN: [id number] Date of Birth: 11-Jun-1945  Today's Date: 11/13/2019  Modified Barium Swallow completed.  Full report located under Chart Review in the Imaging Section.  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 Findings are consistent with her complaints of globus sensation after the swallow. She reports this has been worsening over the past 2-3 weeks to the point of beginning to limit her diet to purees and thin liquids only. Oral phase was WNL. Pharyngeal phase was marked by reduced base of tongue retraction, epiglottic inversion, and subsequent reduced UES opening. Deficits resulted in mod-max residue in pyriform sinus/just above UES with all consistencies. Strategies trialed include: effortful swallow, head turn, chin tuck, alternating liquids/solids. Strategies were all ineffective. She had some improvement in solid residue with liquid wash, but the liquid also remained in pyriform sinus. She had no esophageal deficits observed with very timely clearance of pill. Trace and transient penetration was noted with thin liquids after the swallow from the pyriform residue.   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  Also of concern is acute etiology, as she denies having ever had this problem prior to a few weeks ago.   Swallow Evaluation Recommendations   Recommended Consults: Consider ENT evaluation   SLP Diet Recommendations: Dysphagia 3 (Mech soft) solids;Thin liquid   Liquid Administration via: Cup;Straw   Medication Administration: Whole meds with liquid       Compensations: Follow solids  with liquid       Oral Care Recommendations: Oral care BID       Hailey Moody P. Brittannie Tawney, M.S., CCC-SLP Speech-Language Pathologist Acute [facility] Services Pager: [phone]  Sharaya Boruff P Taishawn Smaldone 11/13/2019,12:59 PM

## 2019-11-14 ENCOUNTER — Encounter: Payer: Self-pay | Admitting: *Deleted

## 2019-11-14 ENCOUNTER — Other Ambulatory Visit: Payer: Self-pay

## 2019-11-15 ENCOUNTER — Ambulatory Visit: Payer: Medicare PPO | Admitting: Neurology

## 2019-11-18 ENCOUNTER — Other Ambulatory Visit: Payer: Self-pay | Admitting: Neurology

## 2019-11-18 DIAGNOSIS — R1310 Dysphagia, unspecified: Secondary | ICD-10-CM

## 2019-11-18 NOTE — Progress Notes (Signed)
ent

## 2019-11-20 ENCOUNTER — Ambulatory Visit
Admission: RE | Admit: 2019-11-20 | Discharge: 2019-11-20 | Disposition: A | Discharge status: Home / Self Care | Payer: Medicare PPO | Source: Ambulatory Visit | Attending: Neurology | Admitting: Neurology

## 2019-11-20 ENCOUNTER — Other Ambulatory Visit: Payer: Self-pay

## 2019-11-20 DIAGNOSIS — R51 Headache with orthostatic component, not elsewhere classified: Secondary | ICD-10-CM

## 2019-11-20 DIAGNOSIS — G9602 Spinal cerebrospinal fluid leak, spontaneous: Secondary | ICD-10-CM

## 2019-11-20 DIAGNOSIS — M542 Cervicalgia: Secondary | ICD-10-CM

## 2019-11-20 DIAGNOSIS — M5481 Occipital neuralgia: Secondary | ICD-10-CM

## 2019-11-20 DIAGNOSIS — G9600 Cerebrospinal fluid leak, unspecified: Secondary | ICD-10-CM

## 2019-11-20 DIAGNOSIS — M5412 Radiculopathy, cervical region: Secondary | ICD-10-CM

## 2019-11-20 DIAGNOSIS — G4452 New daily persistent headache (NDPH): Secondary | ICD-10-CM | POA: Diagnosis not present

## 2019-11-20 MED ORDER — GADOBENATE DIMEGLUMINE 529 MG/ML IV SOLN
8.0000 mL | Freq: Once | INTRAVENOUS | Status: AC | PRN
  Administered 2019-11-20: 8 mL via INTRAVENOUS

## 2019-11-25 ENCOUNTER — Telehealth: Payer: Self-pay | Admitting: Neurology

## 2019-11-25 NOTE — Telephone Encounter (Signed)
I called the pt's daughter Hailey Moody (on [place]) but she was unavailable. I LVM advising we can go over this with pt in the morning at the office visit. Also advised I would send a mychart message to see if this helps address any questions she may have.   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 Thanks! Dr. Lucia Gaskins  IMPRESSION:   MRI brain (with and without) demonstrating: - Mild periventricular, subcortical and pontine foci of nonspecific T2 hyperintensities. - Left inferior cerebellar enhancing lesion, likely dural based, measuring 8 mm. May represent small meningioma. - No acute findings.  MRI c-spine result note from Dr Lucia Gaskins: Hailey Moody, your neck looks great. 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 I doubt you will even notice it over the next 25 years its totally fine. Thanks! Dr. Lucia Gaskins  IMPRESSION:   MRI cervical spine with and without contrast demonstrating: - Small dural based enhancing left inferior cerebellar lesion, may represent small meningioma; see MRI head from same day. - Cervical spine otherwise unremarkable.

## 2019-11-25 NOTE — Telephone Encounter (Signed)
Pt's daughter Charlett Blake called wanting to know if the pt can come in to receive "shots in her head." Please advise.

## 2019-11-25 NOTE — Telephone Encounter (Signed)
The pt also messaged [place] in [place] and I sent her a message back.

## 2019-11-26 ENCOUNTER — Other Ambulatory Visit: Payer: Self-pay

## 2019-11-26 ENCOUNTER — Encounter: Payer: Self-pay | Admitting: Family Medicine

## 2019-11-26 ENCOUNTER — Ambulatory Visit (INDEPENDENT_AMBULATORY_CARE_PROVIDER_SITE_OTHER): Payer: Medicare PPO | Admitting: Family Medicine

## 2019-11-26 VITALS — BP 150/78 | HR 87 | Ht 59.0 in | Wt 87.0 lb

## 2019-11-26 DIAGNOSIS — M5481 Occipital neuralgia: Secondary | ICD-10-CM | POA: Diagnosis not present

## 2019-11-26 DIAGNOSIS — M7918 Myalgia, other site: Secondary | ICD-10-CM | POA: Diagnosis not present

## 2019-11-26 DIAGNOSIS — R1310 Dysphagia, unspecified: Secondary | ICD-10-CM

## 2019-11-26 DIAGNOSIS — R519 Headache, unspecified: Secondary | ICD-10-CM

## 2019-11-26 DIAGNOSIS — R634 Abnormal weight loss: Secondary | ICD-10-CM

## 2019-11-26 MED ORDER — GABAPENTIN 100 MG PO CAPS: 100.0000 mg | ORAL_CAPSULE | Freq: Three times a day (TID) | ORAL | 3 refills | Status: DC

## 2019-11-26 NOTE — Progress Notes (Signed)
PATIENT: Hailey Moody DOB: 07/10/1945  REASON FOR VISIT: follow up HISTORY FROM: patient  Chief Complaint  Patient presents with  . Follow-up    rm 2  . Medication Management    Pt is here for a f/u on medication.     HISTORY OF PRESENT ILLNESS: Today 11/26/19 Hailey Moody is a 74 y.o. female here today for follow up. MRI brian and cervical spine were unremarkable with exception of "Small dural based enhancing left inferior cerebellar lesion, may represent small meningioma". Occipital nerve blocks were helpful but seem to wear off in 2-3 days. She did not notice any benefit of adding steroid to nerve block. Gabapentin helped some with head pain but makes her really tired and groggy, worse when she increased to 350m BID dosing. She feels that she did better taking 1082mcapsules. She was referred to Dr NeErnestina Patchesor consideration of noninvasive procedural treatment of occipital pain. She has an appt on 9/14. She was referred to PT/ST. ST recommended ENT evaluation for dysphagia. She has not heard back from ENT referral. She is not eating normally. She continues to lose weight. She has lost about 7 pounds over the past month. She has hyponatremia, last reading 129. No clear explanation. She has an appt with Dr SuNancy FetterPCP today at 3:30   HISTORY: (copied from Dr AhCathren Laineote on 11/06/2019)  HPI:  Hailey Moody a [age].o. female here [place]. PMHx "mini stroke", hypohtyroidism, HLD, heart attack, fatigue, HTN, COPD, borderline diabetes, arthritis. She is here with her daughter. She has had a temporal artery biopsy. Started May 23rd, she started with a slight headache and she never get a headache no history of migraines or headaches. Since then she started having worsening headaches, 2 days later her headache was so bad she had to go to the clinic and she was in severe pain, positional quality better when laying down and worse throughout the day. Not resolved  when laying down. It has never gone away completely. It feels like and electrode, shoots up to the top of the head and starts from the occipital area and shoot up. Pounding, pulsating, throbbing, continuous 24x7, waxes and wanes, she has a lot of neck pain and soreness in her shoulders. Also having significant neck and right shoulder pain. She was seen by rheumatology and had an evaluation. Some light sensitivity but only when the headache is severe, no inciting events, no falls, no new medications before it started. Prior to this very active. Daughter proides much information as well, she was quite well prior to this one day. Steroids did not help.  Headaches reported as severe and worsening.  Patient also reports difficulty swallowing, dysphagia, needs a swallow study.  No other focal neurologic deficits, associated symptoms, inciting events or modifiable factors.  Reviewed notes, labs and imaging from outside physicians, which showed:  I reviewed over 100 pages of records. Pertinent findings include: Patient was seen by her primary care physician and I reviewed those records: Who referred her to rheumatology, DEXA scan showed osteoporosis she was started on Fosamax, chronic conditions include hypothyroidism, essential hypertension, hyperlipidemia, fatigue and vitamin D deficiency and new daily persistent headache patient reported acute onset daily headache, patient had a biopsy of the left temporal artery, biopsy was negative, patient continued to have daily headaches, she had been to the emergency room due to headache and elevated blood pressure, patient had multiple CTs of the head which were  normal, had ultrasound of the arteries in the neck that she reported was 69% blocked, headaches reported as severe.  Per her primary care, blood pressure seems to be fairly well controlled most of the time, patient also had a complete cardiac work-up and she was also sent back to cardiology for evaluation, she does  have chronic right shoulder pain and osteoarthritis.  I reviewed CT of the head report (we will request CD): September 11, 2019: No acute intracerebral hemorrhage, subarachnoid bleed or subdural hematoma noted, overall appearance and essentially stable and unchanged accounting for technical variance from a prior MRI, there is at best minimal central atrophic changes.  I reviewed records from [facility] in [place]:  Bone densitometry: October 21, 2019, BMD of the total lumbar spine, L1-L4 measures 0.644 g/cm with a T score of -3.7 and a D Z score of -1.2 considered within the range of osteoporosis.  BMD of the left femoral neck measures 0.520 g/cm with a T score of -3 and a Z score of -0.9 this is considered the range of osteoporosis.  Increased risk of fracture.  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  They recommended local wound care to left temporal incision site, frontal lesion removed appears to be a possible basal cell cancer lesion.  Reviewed rheumatologic testing that was normal including antidouble-stranded DNA IgG, anti-Smith IgG, CD4 erythrocyte bound and B lymphocyte bound, ANA IgG, SSA, SSB, SCL 70, centromere antibodies IgG, Jo 1 antibodies, CCP antibodies, RF, IgM, RF IgA, CCP, IgG, anticardiolipin IgM and IgG, beta-2 glycoprotein antibodies IgM and IgG, antithyroglobulin IgG.  She had CRP elevation and ERC elevation, CK normal, hepatitis C core antibodies negative, hepatitis B surface antigen negative, hepatitis total core antibody is negative, TB Gold negative, normal vitamin D.  She did have a positive ANA antibody 01/15/2019 but notes state rheumatology evaluation at a later date ANA negative (see  above).  Noted +Bouchard nodes, +Heberden's nodes, stiffness with active range of motion in the shoulders.  She noted little benefit with 14 days of steroids 54m.  She did have positive antithyroid peroxidase IgG.   I reviewed the lab results provided and documented them here: ESR 81, CRP 50, CK 52, CMP unremarkable, CBC unremarkable, RF less than 10 vitamin D 25-hydroxy 56.7, CCP antibodies normal, antidouble-stranded DNA IgG 27 within normal limits, anti-Smith IgG less than 1 within normal limits, erythrocyte bound CD4 and beta lymphocyte bound CD4 both within normal limits, ANA IgG +20 units, following lab tests normal: Anti-SSB, anti-SCL 70, anticentromere protein B, anti-Jo 1, anti-CCP, ANA titer was 1-3 20 which was positive and speckled, antiphospholipid syndrome and auto antibodies were all within normal limits, antithyroglobulin IgG was positive at 105, antithyroid peroxidase IgG was within normal limits, antihistone IgG was normal.  The following labs are all within normal limits anti-C1q IgG, antiribosomal IgG, antiphosphatidyl prothrombin IgM and IgG, anticardiolipin IgM/IgG/IgA, antibeta-2 glycoprotein IgM/IgG/IgA.  (collected September 09, 2019)   REVIEW OF SYSTEMS: Out of a complete 14 system review of symptoms, the patient complains only of the following symptoms, and all other reviewed systems are negative.  ALLERGIES: Allergies  Allergen Reactions  . Penicillins     "passed out"    HOME MEDICATIONS: Outpatient Medications Prior to  Visit  Medication Sig Dispense Refill  . albuterol (ACCUNEB) 1.25 MG/3ML nebulizer solution Take 3 mLs by nebulization. Three times per day as needed.    . Albuterol Sulfate (PROAIR HFA IN) Inhale 2 puffs into the lungs. Every 4-6 hours as needed.    Marland Kitchen amLODipine (NORVASC) 5 MG tablet Take 5 mg by mouth daily as needed.    . busPIRone HCl (BUSPAR PO) Take by mouth.    . Calcium Carbonate-Vitamin D3 (CALCIUM 600/VITAMIN D) 600-400 MG-UNIT TABS Take 1  tablet by mouth in the morning and at bedtime.    . ergocalciferol (VITAMIN D2) 1.25 MG (50000 UT) capsule Take 50,000 Units by mouth once a week. On Tuesdays    . HYDROcodone-acetaminophen (NORCO/VICODIN) 5-325 MG tablet Take 1 tablet by mouth every 8 (eight) hours.     Marland Kitchen levothyroxine (SYNTHROID) 125 MCG tablet Take 125 mcg by mouth daily.    Marland Kitchen losartan (COZAAR) 100 MG tablet Take 100 mg by mouth daily.    . metoprolol tartrate (LOPRESSOR) 25 MG tablet Take 25 mg by mouth daily.    Marland Kitchen omeprazole (PRILOSEC) 20 MG capsule Take 20 mg by mouth daily.    . sertraline (ZOLOFT) 100 MG tablet Take 100 mg by mouth daily.     Marland Kitchen SPIRIVA HANDIHALER 18 MCG inhalation capsule Place 1 capsule into inhaler and inhale daily.    Marland Kitchen gabapentin (NEURONTIN) 300 MG capsule Take 1 capsule (300 mg total) by mouth 3 (three) times daily. 90 capsule 6   No facility-administered medications prior to visit.    PAST MEDICAL HISTORY: Past Medical History:  Diagnosis Date  . Arthritis   . Borderline diabetes   . COPD (chronic obstructive pulmonary disease) (HCC)    on spiriva, nebulizer  . Essential hypertension   . Fatigue    unspecified  . Heart attack ([place])    "mild"  . Hyperlipidemia   . Hypothyroidism   . Mini stroke ([place])   . Vitamin D deficiency     PAST SURGICAL HISTORY: Past Surgical History:  Procedure Laterality Date  . temporal artery biopsy Left 10/02/2019    FAMILY HISTORY: Family History  Problem Relation Age of Onset  . Diabetes type I Child   . Stroke Brother   . Heart attack Mother   . Heart attack Father     SOCIAL HISTORY: Social History   Socioeconomic History  . Marital status: Single    Spouse name: Not on file  . Number of children: Not on file  . Years of education: Not on file  . Highest education level: Not on file  Occupational History  . Not on file  Tobacco Use  . Smoking status: Current Every Day Smoker    Packs/day: 0.50    Types: Cigarettes  . Smokeless  tobacco: Never Used  Substance and Sexual Activity  . Alcohol use: Not Currently  . Drug use: Never  . Sexual activity: Not on file  Other Topics Concern  . Not on file  Social History Narrative   Lives with her grandson in [place] but currently here living with her daughter due to medical reasons   Right handed   Caffeine: about 2 cups/day lately, normally 5.   Social Determinants of Health   Financial Resource Strain:   . Difficulty of Paying Living Expenses: Not on file  Food Insecurity:   . Worried About Charity fundraiser in the Last Year: Not on file  . Ran Out of Food in the  Last Year: Not on file  Transportation Needs:   . Lack of Transportation (Medical): Not on file  . Lack of Transportation (Non-Medical): Not on file  Physical Activity:   . Days of Exercise per Week: Not on file  . Minutes of Exercise per Session: Not on file  Stress:   . Feeling of Stress : Not on file  Social Connections:   . Frequency of Communication with Friends and Family: Not on file  . Frequency of Social Gatherings with Friends and Family: Not on file  . Attends Religious Services: Not on file  . Active Member of Clubs or Organizations: Not on file  . Attends Archivist Meetings: Not on file  . Marital Status: Not on file  Intimate Partner Violence:   . Fear of Current or Ex-Partner: Not on file  . Emotionally Abused: Not on file  . Physically Abused: Not on file  . Sexually Abused: Not on file      PHYSICAL EXAM  Vitals:   11/26/19 0855 11/26/19 0900 11/26/19 0930  BP: (!) 194/88 (!) 160/88 (!) 150/78  Pulse: 87    Weight: 87 lb (39.5 kg)    Height: 4' 11"  (1.499 m)     Body mass index is 17.57 kg/m.  Generalized: Well developed, in no acute distress  Cardiology: normal rate and rhythm, no murmur noted Respiratory: clear to auscultation bilaterally  Neurological examination  Mentation: Alert oriented to time, place, history taking. Follows all commands speech  and language fluent Cranial nerve II-XII: Pupils were equal round reactive to light. Extraocular movements were full, visual field were full on confrontational test. Facial sensation and strength were normal. Uvula tongue midline. Head turning and shoulder shrug  were normal and symmetric. Motor: The motor testing reveals 5 over 5 strength of all 4 extremities. Good symmetric motor tone is noted throughout.  Sensory: Sensory testing is intact to soft touch on all 4 extremities. No evidence of extinction is noted.  Coordination: Cerebellar testing reveals good finger-nose-finger and heel-to-shin bilaterally.  Gait and station: Gait is normal.   DIAGNOSTIC DATA (LABS, IMAGING, TESTING) - I reviewed patient records, labs, notes, testing and imaging myself where available.  No flowsheet data found.   Lab Results  Component Value Date   WBC 9.5 11/06/2019   HGB 13.2 11/06/2019   HCT 39.0 11/06/2019   MCV 84 11/06/2019   PLT 486 (H) 11/06/2019      Component Value Date/Time   NA 129 (L) 11/06/2019 1520   K 4.3 11/06/2019 1520   CL 88 (L) 11/06/2019 1520   CO2 26 11/06/2019 1520   GLUCOSE 87 11/06/2019 1520   BUN 16 11/06/2019 1520   CREATININE 0.87 11/06/2019 1520   CALCIUM 10.1 11/06/2019 1520   PROT 7.1 11/06/2019 1520   ALBUMIN 4.1 11/06/2019 1520   AST 18 11/06/2019 1520   ALT 11 11/06/2019 1520   ALKPHOS 124 (H) 11/06/2019 1520   BILITOT 0.4 11/06/2019 1520   GFRNONAA 65 11/06/2019 1520   GFRAA 75 11/06/2019 1520   No results found for: CHOL, HDL, LDLCALC, LDLDIRECT, TRIG, CHOLHDL No results found for: HGBA1C No results found for: VITAMINB12 Lab Results  Component Value Date   TSH 20.800 (H) 11/06/2019      ASSESSMENT AND PLAN 74 y.o. year old female  has a past medical history of Arthritis, Borderline diabetes, COPD (chronic obstructive pulmonary disease) ([place]), Essential hypertension, Fatigue, Heart attack ([place]), Hyperlipidemia, Hypothyroidism, Mini stroke ([place]),  and Vitamin D  deficiency. here with     ICD-10-CM   1. Cervico-occipital neuralgia  M54.81   2. Dysphagia, unspecified type  R13.10   3. Occipital headache  R51.9   4. Myofascial pain syndrome, cervical  M79.18   5. Weight loss  R63.4     Hailey Moody continues to have consistent occipital neuralgic pain. She has an appt with Dr Domingo Cocking on 9/14. She requests a nerve block today and this was given as documented in procedure note. She has not been able to tolerate 390m dosing of gabapentin and feels she was able to better tolerate lower doses. She will start 1068mcapsules three times daily. She is not interested in trying Lyrica. Na levels have been low so we will avoid oxcarbamazepine. She is looking forward to possible RFA with Dr FrDomingo Cockingf appropriate. She continues to have difficulty swallowing. I am concerned that anxiety associated with dysphagia has contributed to decreased appetite and weight loss. I have given her and her son-in-law information to schedule ENT visit with Dr NeLucia GaskinsThey will call and schedule evaluation asap. We have discussed MRI results. No obvious CVA. She was educated on diagnosis of meningioma and additional information given for family to review. I have asked her to follow up closely with PCP for BP monitoring and management of co morbidities. She reports chronic shoulder pain. May consider PT if needed pending upcoming evaluations. She was encouraged to stay well hydrated. She will focus on healthy, well balanced meals. She will follow up with us[place]orean 3 months, sooner if needed.   No orders of the defined types were placed in this encounter.    Meds ordered this encounter  Medications  . gabapentin (NEURONTIN) 100 MG capsule    Sig: Take 1 capsule (100 mg total) by mouth 3 (three) times daily.    Dispense:  270 capsule    Refill:  3    Order Specific Question:   Supervising Provider    Answer:   AHMelvenia [id number]    I spent 45 minutes with the patient.  50% of this time was spent counseling and educating patient on plan of care and medications.     AmDebbora PrestoFNP-C 11/26/2019, 4:40 PM [facility] [address] [phone]

## 2019-11-26 NOTE — Progress Notes (Addendum)
History:   please see office note       Bupivicaine injection/Lidocaine protocol for occipital neuralgia   Performed by Shawnie Dapper, FNP.30-gauge needle was used. All procedures as documented were medically necessary, reasonable and appropriate based on the patient's history, medical diagnosis and physician opinion. Verbal informed consent was obtained from the patient, patient was informed of potential risk of procedure, including bruising, bleeding, hematoma formation, infection, muscle weakness, muscle pain, numbness, transient hypertension, transient hyperglycemia and transient insomnia among others. All areas injected were topically clean with isopropyl rubbing alcohol. Nonsterile nonlatex gloves were worn during the procedure.   1. Greater occipital nerve block [phone]). The greater occipital nerve site was identified at the nuchal line medial to the occipital artery. Medication was injected into the left and right occipital nerve areas and suboccipital areas. Patient's condition is associated with inflammation of the greater occipital nerve and associated multiple groups. Injection was deemed medically necessary, reasonable and appropriate. Injection represents a separate and unique surgical service.    The patient tolerated the injections well, no complications of the procedure were noted. Injections were made with a 30-gauge needle.  Made any corrections needed, and agree with procedure.  Naomie Dean, MD [facility]

## 2019-11-26 NOTE — Patient Instructions (Addendum)
We will switch gabapentin to 100mg  capsules up to 3 times daily. You have an appt with Dr on 9/14 at 10:30. Please contact Dr 10/14 office (ENT) and speak with Allene Pyo to set up appt. Their number is [phone].   Try to stay well hydrated. Try to eat regular meals.   Follow up closely with PCP today. Continue to monitor blood pressure at home.   Follow up with [phone] in 3 months   Meningioma Meningioma is a tumor that occurs in the thin tissue that covers the brain and spinal cord (meninges). Meningiomas are usually not cancerous (benign) and do not spread to other areas. In rare cases, a meningioma may become cancerous (malignant). What are the causes? In many cases, the cause of this condition is not known. In some cases, meningioma may be caused by:  Having a genetic disorder that causes multiple soft tumors (neurofibromatosis 2).  A change in certain genes (genetic mutation). What increases the risk? You are more likely to develop this condition if:  You have been exposed to radiation.  You are an older woman. Older women have a higher risk of meningiomas than men or children. However, men have a higher risk of malignant meningiomas.  You have injured your head in the past.  You have a history of breast cancer. What are the signs or symptoms? Symptoms of this condition usually begin very slowly. The symptoms may depend on the size and location of the tumor. Possible symptoms include:  Headaches.  Nausea and vomiting.  Vision changes.  Hearing changes.  Loss of the sense of smell.  Fits of uncontrolled movements (seizures).  Weakness or numbness on one side of the body or in an arm or leg.  Mood or personality changes.  Problems with memory or thinking. How is this diagnosed? This condition is diagnosed based on:  Results of brain imaging tests, such as a CT scan or MRI.  Removal and testing of a sample of the tumor (biopsy). This may be done to  confirm the diagnosis and to help determine the best treatment for the condition. How is this treated? You may not have treatment until your symptoms start to affect your daily activities. This is because meningioma grows so slowly, and your health care provider may prefer to monitor its growth before starting treatment. If you do need treatment, it may include:  Medicines to decrease brain swelling and improve symptoms (steroids).  High-energy rays (radiation therapy) to shrink or kill the tumor.  Anti-cancer medicines (chemotherapy) to shrink or kill the tumor. Chemotherapy has many side effects because it also kills healthy cells.  Targeted therapy. This kills cancerous cells without affecting normal cells.  Surgery to remove as much of the tumor as possible. Follow these instructions at home:   Take over-the-counter and prescription medicines only as told by your health care provider.  Keep all follow-up visits as told by your health care provider. This is important. You may need regular visits to monitor the growth of your tumor. Contact a health care provider if:  You have symptoms that come back.  You have diarrhea.  You vomit.  You have abdominal pain.  You cannot eat or drink as much as you need.  You are weaker or more tired than usual.  You are losing weight without trying. Get help right away if:  Your diarrhea, vomiting, or abdominal pain does not go away.  You have new symptoms, such as vision problems or difficulty  walking.  You have a seizure.  You have bleeding that does not stop.  You have trouble breathing.  You have a fever. Summary  Meningioma is a tumor that occurs in the thin tissue that covers the brain and spinal cord (meninges).  Meningiomas are usually benign, which means they are not cancerous and do not spread to other areas.  Symptoms of this condition usually begin very slowly. The symptoms may depend on the size and location of the  tumor.  Your tumor may be monitored over time. You may not need treatment until your tumor starts to affect your daily life. This information is not intended to replace advice given to you by your health care provider. Make sure you discuss any questions you have with your health care provider. Document Revised: 02/24/2017 Document Reviewed: 03/18/2016 Elsevier Patient Education  2020 Elsevier Inc.  Occipital Neuralgia  Occipital neuralgia is a type of headache that causes brief episodes of very bad pain in the back of your head. Pain from occipital neuralgia may spread (radiate) to other parts of your head. These headaches may be caused by irritation of the nerves that leave your spinal cord high up in your neck, just below the base of your skull (occipital nerves). Your occipital nerves transmit sensations from the back of your head, the top of your head, and the areas behind your ears. What are the causes? This condition can occur without any known cause (primary headache syndrome). In other cases, this condition is caused by pressure on or irritation of one of the two occipital nerves. Pressure and irritation may be due to:  Muscle spasm in the neck.  Neck injury.  Wear and tear of the vertebrae in the neck (osteoarthritis).  Disease of the disks that separate the vertebrae.  Swollen blood vessels that put pressure on the occipital nerves.  Infections.  Tumors.  Diabetes. What are the signs or symptoms? This condition causes brief burning, stabbing, electric, shocking, or shooting pain which can radiate to the top of the head. It can happen on one side or both sides of the head. It can also cause:  Pain behind the eye.  Pain triggered by neck movement or hair brushing.  Scalp tenderness.  Aching in the back of the head between episodes of very bad pain.  Pain gets worse with exposure to bright lights. How is this diagnosed? There is no test that diagnoses this  condition. Your health care provider may diagnose this condition based on a physical exam and your symptoms. Other tests may be done, such as:  Imaging studies of the brain and neck (cervical spine), such as an MRI or CT scan. These look for causes of pinched nerves.  Applying pressure to the nerves in the neck to try to re-create the pain.  Injection of numbing medicine into the occipital nerve areas to see if pain goes away (diagnostic nerve block). How is this treated? Treatment for this condition may begin with simple measures, such as:  Rest.  Massage.  Applying heat or cold on the area.  Over-the-counter pain relievers. If these measures do not work, you may need other treatments, including:  Medicines, such as: ? Prescription-strength anti-inflammatory medicines. ? Muscle relaxants. ? Anti-seizure medicines, which can relieve pain. ? Antidepressants, which can relieve pain. ? Injected medicines, such as medicines that numb the area (local anesthetic) and steroids.  Pulsed radiofrequency ablation. This is when wires are implanted to deliver electrical impulses that block pain signals from  the occipital nerve.  Surgery to relieve nerve pressure.  Physical therapy. Follow these instructions at home: Pain management      Avoid any activities that cause pain.  Rest when you have an attack of pain.  Try gentle massage to relieve pain.  Try a different pillow or sleeping position.  If directed, apply heat to the affected area as told by your health care provider. Use the heat source that your health care provider recommends, such as a moist heat pack or a heating pad. ? Place a towel between your skin and the heat source. ? Leave the heat on for 20-30 minutes. ? Remove the heat if your skin turns bright red. This is especially important if you are unable to feel pain, heat, or cold. You may have a greater risk of getting burned.  If directed, apply ice to the back of  the head and neck area as told by your health care provider. ? Put ice in a plastic bag. ? Place a towel between your skin and the bag. ? Leave the ice on for 20 minutes, 2-3 times per day. General instructions  Take over-the-counter and prescription medicines only as told by your health care provider.  Avoid things that make your symptoms worse, such as bright lights.  Try to stay active. Get regular exercise that does not cause pain. Ask your health care provider to suggest safe exercises for you.  Work with a physical therapist to learn stretching exercises you can do at home.  Practice good posture.  Keep all follow-up visits as told by your health care provider. This is important. Contact a health care provider if:  Your medicine is not working.  You have new or worsening symptoms. Get help right away if:  You have very bad head pain that does not go away.  You have a sudden change in vision, balance, or speech. Summary  Occipital neuralgia is a type of headache that causes brief episodes of very bad pain in the back of your head.  Pain from occipital neuralgia may spread (radiate) to other parts of your head.  Treatment for this condition includes rest, massage, and medicines. This information is not intended to replace advice given to you by your health care provider. Make sure you discuss any questions you have with your health care provider. Document Revised: 02/28/2017 Document Reviewed: 05/19/2016 Elsevier Patient Education  2020 Elsevier Inc.   Dysphagia  Dysphagia is trouble swallowing. This condition occurs when solids and liquids stick in a person's throat on the way down to the stomach, or when food takes longer to get to the stomach than usual. You may have problems swallowing food, liquids, or both. You may also have pain while trying to swallow. It may take you more time and effort to swallow something. What are the causes? This condition may be caused  by:  Muscle problems. They may make it difficult for you to move food and liquids through the esophagus, which is the tube that connects your mouth to your stomach.  Blockages. You may have ulcers, scar tissue, or inflammation that blocks the normal passage of food and liquids. Causes of these problems include: ? Acid reflux from your stomach into your esophagus (gastroesophageal reflux). ? Infections. ? Radiation treatment for cancer. ? Medicines taken without enough fluids to wash them down into your stomach.  Stroke. This can affect the nerves and make it difficult to swallow.  Nerve problems. These prevent signals from being sent  to the muscles of your esophagus to squeeze (contract) and move what you swallow down to your stomach.  Globus pharyngeus. This is a common problem that involves a feeling like something is stuck in your throat or a sense of trouble with swallowing, even though nothing is wrong with the swallowing passages.  Certain conditions, such as cerebral palsy or Parkinson's disease. What are the signs or symptoms? Common symptoms of this condition include:  A feeling that solids or liquids are stuck in your throat on the way down to the stomach.  Pain while swallowing.  Coughing or gagging while trying to swallow. Other symptoms include:  Food moving back from your stomach to your mouth (regurgitation).  Noises coming from your throat.  Chest discomfort with swallowing.  A feeling of fullness when swallowing.  Drooling, especially when the throat is blocked.  Heartburn. How is this diagnosed? This condition may be diagnosed by:  Barium X-ray. In this test, you will swallow a white liquid that sticks to the inside of your esophagus. X-ray images are then taken.  Endoscopy. In this test, a flexible telescope is inserted down your throat to look at your esophagus and your stomach.  CT scans and an MRI. How is this treated? Treatment for dysphagia  depends on the cause of this condition, such as:  If the dysphagia is caused by acid reflux or infection, medicines may be used. They may include antibiotics and heartburn medicines.  If the dysphagia is caused by problems with the muscles, swallowing therapy may be used to help you strengthen your swallowing muscles. You may have to do specific exercises to strengthen the muscles or stretch them.  If the dysphagia is caused by a blockage or mass, procedures to remove the blockage may be done. You may need surgery and a feeding tube. You may need to make diet changes. Ask your health care provider for specific instructions. Follow these instructions at home: Medicines  Take over-the-counter and prescription medicines only as told by your health care provider.  If you were prescribed an antibiotic medicine, take it as told by your health care provider. Do not stop taking the antibiotic even if you start to feel better. Eating and drinking   Follow any diet changes as told by your health care provider.  Work with a diet and nutrition specialist (dietitian) to create an eating plan that will help you get the nutrients you need in order to stay healthy.  Eat soft foods that are easier to swallow.  Cut your food into small pieces and eat slowly. Take small bites.  Eat and drink only when you are sitting upright.  Do not drink alcohol or caffeine. If you need help quitting, ask your health care provider. General instructions  Check your weight every day to make sure you are not losing weight.  Do not use any products that contain nicotine or tobacco, such as cigarettes, e-cigarettes, and chewing tobacco. If you need help quitting, ask your health care provider.  Keep all follow-up visits as told by your health care provider. This is important. Contact a health care provider if you:  Lose weight because you cannot swallow.  Cough when you drink liquids.  Cough up partially digested  food. Get help right away if you:  Cannot swallow your saliva.  Have shortness of breath, a fever, or both.  Have a hoarse voice and also have trouble swallowing. Summary  Dysphagia is trouble swallowing. This condition occurs when solids and liquids  stick in a person's throat on the way down to the stomach. You may cough or gag while trying to swallow.  Dysphagia has many possible causes.  Treatment for dysphagia depends on the cause of the condition.  Keep all follow-up visits as told by your health care provider. This is important. This information is not intended to replace advice given to you by your health care provider. Make sure you discuss any questions you have with your health care provider. Document Revised: 08/08/2018 Document Reviewed: 08/08/2018 Elsevier Patient Education  2020 [employer]

## 2019-12-09 ENCOUNTER — Other Ambulatory Visit: Payer: Self-pay

## 2019-12-09 ENCOUNTER — Encounter (INDEPENDENT_AMBULATORY_CARE_PROVIDER_SITE_OTHER): Payer: Self-pay | Admitting: Otolaryngology

## 2019-12-09 ENCOUNTER — Ambulatory Visit (INDEPENDENT_AMBULATORY_CARE_PROVIDER_SITE_OTHER): Payer: Medicare PPO | Admitting: Otolaryngology

## 2019-12-09 VITALS — Temp 97.3°F

## 2019-12-09 DIAGNOSIS — J3801 Paralysis of vocal cords and larynx, unilateral: Secondary | ICD-10-CM | POA: Diagnosis not present

## 2019-12-09 DIAGNOSIS — R1314 Dysphagia, pharyngoesophageal phase: Secondary | ICD-10-CM | POA: Diagnosis not present

## 2019-12-09 NOTE — Progress Notes (Signed)
HPI: Hailey Moody is a 74 y.o. female who presents is referred by Dr. Lucia Gaskins for evaluation of dysphagia. Patient recently moved here from [place] to be with her daughter. She has been having severe left-sided headaches for over 4 months. More recently has been having trouble swallowing with weight loss and underwent a modified barium swallow that demonstrated some pooling in the vallecula as well as the piriform regions. There was no obvious structural abnormality although she had a prominent cricopharyngeus muscle. They've recommended physical therapy to assist with swallowing as well as ENT evaluation. Patient moved here from [place] to be with her daughter. She does smoke half a pack a day. She does not hear well and has noticed some change in her voice recently. But her main problem has been the left-sided headaches. She apparently has had previous injections in her neck as well as the back of her head to try to help control the headaches..  Past Medical History:  Diagnosis Date   Arthritis    Borderline diabetes    COPD (chronic obstructive pulmonary disease) (HCC)    on spiriva, nebulizer   Essential hypertension    Fatigue    unspecified   Heart attack (HCC)    "mild"   Hyperlipidemia    Hypothyroidism    Mini stroke (HCC)    Vitamin D deficiency    Past Surgical History:  Procedure Laterality Date   temporal artery biopsy Left 10/02/2019   Social History   Socioeconomic History   Marital status: Single    Spouse name: Not on file   Number of children: Not on file   Years of education: Not on file   Highest education level: Not on file  Occupational History   Not on file  Tobacco Use   Smoking status: Current Every Day Smoker    Packs/day: 0.50    Years: 60.00    Pack years: 30.00    Types: Cigarettes    Start date: 61   Smokeless tobacco: Never Used  Substance and Sexual Activity   Alcohol use: Not Currently   Drug use: Never   Sexual  activity: Not on file  Other Topics Concern   Not on file  Social History Narrative   Lives with her grandson in [place] but currently here living with her daughter due to medical reasons   Right handed   Caffeine: about 2 cups/day lately, normally 5.   Social Determinants of Health   Financial Resource Strain:    Difficulty of Paying Living Expenses: Not on file  Food Insecurity:    Worried About Programme researcher, broadcasting/film/video in the Last Year: Not on file   [employer] of Food in the Last Year: Not on file  Transportation Needs:    Lack of Transportation (Medical): Not on file   Lack of Transportation (Non-Medical): Not on file  Physical Activity:    Days of Exercise per Week: Not on file   Minutes of Exercise per Session: Not on file  Stress:    Feeling of Stress : Not on file  Social Connections:    Frequency of Communication with Friends and Family: Not on file   Frequency of Social Gatherings with Friends and Family: Not on file   Attends Religious Services: Not on file   Active Member of Clubs or Organizations: Not on file   Attends Banker Meetings: Not on file   Marital Status: Not on file   Family History  Problem Relation Age of  Onset   Diabetes type I Child    Stroke Brother    Heart attack Mother    Heart attack Father    Allergies  Allergen Reactions   Penicillins     "passed out"   Prior to Admission medications   Medication Sig Start Date End Date Taking? Authorizing Provider  albuterol (ACCUNEB) 1.25 MG/3ML nebulizer solution Take 3 mLs by nebulization. Three times per day as needed.   Yes [provider]  Albuterol Sulfate (PROAIR HFA IN) Inhale 2 puffs into the lungs. Every 4-6 hours as needed.   Yes [provider]  amLODipine (NORVASC) 5 MG tablet Take 5 mg by mouth daily as needed.   Yes [provider]  busPIRone HCl (BUSPAR PO) Take by mouth.   Yes [provider]  Calcium Carbonate-Vitamin  D3 (CALCIUM 600/VITAMIN D) 600-400 MG-UNIT TABS Take 1 tablet by mouth in the morning and at bedtime.   Yes [provider]  ergocalciferol (VITAMIN D2) 1.25 MG (50000 UT) capsule Take 50,000 Units by mouth once a week. On Tuesdays   Yes [provider]  gabapentin (NEURONTIN) 100 MG capsule Take 1 capsule (100 mg total) by mouth 3 (three) times daily. 11/26/19  Yes Lomax, Amy, NP  HYDROcodone-acetaminophen (NORCO/VICODIN) 5-325 MG tablet Take 1 tablet by mouth every 8 (eight) hours.    Yes [provider]  levothyroxine (SYNTHROID) 125 MCG tablet Take 125 mcg by mouth daily. 10/16/19  Yes [provider]  losartan (COZAAR) 100 MG tablet Take 100 mg by mouth daily. 08/21/19  Yes [provider]  metoprolol tartrate (LOPRESSOR) 25 MG tablet Take 25 mg by mouth daily. 08/21/19  Yes [provider]  omeprazole (PRILOSEC) 20 MG capsule Take 20 mg by mouth daily. 09/17/19  Yes [provider]  sertraline (ZOLOFT) 100 MG tablet Take 100 mg by mouth daily.    Yes [provider]  SPIRIVA HANDIHALER 18 MCG inhalation capsule Place 1 capsule into inhaler and inhale daily. 10/16/19  Yes [provider]     Positive ROS: Otherwise negative  All other systems have been reviewed and were otherwise negative with the exception of those mentioned in the HPI and as above.  Physical Exam: Constitutional: Alert, well-appearing, no acute distress Ears: External ears without lesions or tenderness. She has minimal wax buildup in her ear canals. TMs are clear bilaterally. With screening hearing with a tuning forks she has marked decreased hearing in both ears. Nasal: External nose without lesions. Septum slightly deviated to the right.. Nasal passages are otherwise clear with a clear middle meatus bilaterally. No polyps no signs of infection. Oral: Lips and gums without lesions. Tongue and palate mucosa without lesions. Posterior oropharynx  clear. Indirect laryngoscopy revealed a clear base of tongue vallecula and epiglottis. Fiberoptic laryngoscopy was performed to the left nostril and on fiberoptic laryngoscopy the nasal cavity and nasopharynx was clear. The base of tongue vallecula and epiglottis were normal. On evaluation the vocal cords the vocal cords were clear bilaterally but she had diminished mobility of the left true vocal cord. I was able to pass the fiberoptic laryngoscope through the upper esophageal sphincter without difficulty and the upper cervical esophagus was clear. Neck: No palpable adenopathy or masses. No palpable thyroid nodules or lymphadenopathy along the jugular chain of lymph nodes on either side. Respiratory: Breathing comfortably  Skin: No facial/neck lesions or rash noted.  Procedures  Assessment: Pharyngeal esophageal dysphagia as noted on modified barium swallow. No obvious  structural abnormalities noted although she had diminished mobility of the left true vocal cord.  Plan: Since patient has diminished mobility of the left true vocal cord or left vocal cord paresis have recommended that we obtain a CT scan of her neck to evaluate the recurrent nerve to make sure she has no neoplasm or malignancy that may be affecting the left recurrent nerve since she is a smoker. Concerning her dysphagia will refer her to speech therapy to assist in swallowing PT.   Narda Bonds, MD   CC:

## 2019-12-10 ENCOUNTER — Other Ambulatory Visit (INDEPENDENT_AMBULATORY_CARE_PROVIDER_SITE_OTHER): Payer: Self-pay

## 2019-12-10 ENCOUNTER — Ambulatory Visit: Payer: Medicare PPO | Admitting: Physical Medicine and Rehabilitation

## 2019-12-10 ENCOUNTER — Other Ambulatory Visit: Payer: Self-pay

## 2019-12-10 ENCOUNTER — Encounter: Payer: Self-pay | Admitting: Physical Medicine and Rehabilitation

## 2019-12-10 ENCOUNTER — Telehealth: Payer: Self-pay | Admitting: Physical Medicine and Rehabilitation

## 2019-12-10 VITALS — BP 163/78 | HR 90

## 2019-12-10 DIAGNOSIS — M5481 Occipital neuralgia: Secondary | ICD-10-CM

## 2019-12-10 DIAGNOSIS — M47812 Spondylosis without myelopathy or radiculopathy, cervical region: Secondary | ICD-10-CM | POA: Diagnosis not present

## 2019-12-10 DIAGNOSIS — M542 Cervicalgia: Secondary | ICD-10-CM

## 2019-12-10 DIAGNOSIS — R1314 Dysphagia, pharyngoesophageal phase: Secondary | ICD-10-CM

## 2019-12-10 NOTE — Progress Notes (Signed)
Started having headaches about 4 months ago. Started having shots with Dr. Lucia Gaskins. These along with the Gabapentin were helping. She states that the gabapentin started causing problems, so she has reduced this.  Also having left sided back pain. Pain is in low and mid back and sometimes goes up to shoulder. Per daughter, patient's feet started swelling a few days ago and they have gotten worse each day. Numeric Pain Rating Scale and Functional Assessment Average Pain 6   In the last MONTH (on 0-10 scale) has pain interfered with the following?  1. General activity like being  able to carry out your everyday physical activities such as walking, climbing stairs, carrying groceries, or moving a chair?  Rating(7)

## 2019-12-10 NOTE — Telephone Encounter (Signed)
Needs PA for [id number]. Scheduled for 9/16.

## 2019-12-11 ENCOUNTER — Telehealth (INDEPENDENT_AMBULATORY_CARE_PROVIDER_SITE_OTHER): Payer: Self-pay

## 2019-12-11 NOTE — Telephone Encounter (Signed)
Pt has been approve Auth# [id number], pt can be sch.

## 2019-12-12 ENCOUNTER — Encounter: Payer: Self-pay | Admitting: Physical Medicine and Rehabilitation

## 2019-12-12 ENCOUNTER — Ambulatory Visit: Payer: Medicare PPO | Admitting: Physical Medicine and Rehabilitation

## 2019-12-12 ENCOUNTER — Other Ambulatory Visit: Payer: Self-pay

## 2019-12-12 ENCOUNTER — Ambulatory Visit: Payer: Self-pay

## 2019-12-12 VITALS — BP 167/91 | HR 111

## 2019-12-12 DIAGNOSIS — M5481 Occipital neuralgia: Secondary | ICD-10-CM

## 2019-12-12 DIAGNOSIS — M542 Cervicalgia: Secondary | ICD-10-CM | POA: Diagnosis not present

## 2019-12-12 DIAGNOSIS — M47812 Spondylosis without myelopathy or radiculopathy, cervical region: Secondary | ICD-10-CM

## 2019-12-12 MED ORDER — BUPIVACAINE HCL 0.5 % IJ SOLN: 3.0000 mL | Freq: Once | INTRAMUSCULAR | Status: DC

## 2019-12-12 MED ORDER — METHYLPREDNISOLONE ACETATE 80 MG/ML IJ SUSP: 80.0000 mg | Freq: Once | INTRAMUSCULAR | Status: DC

## 2019-12-12 NOTE — Progress Notes (Signed)
Pt state she has Head and neck pain. Pt state when she bending over pain gets worse. Pt state pain meds help with sum relief.  Numeric Pain Rating Scale and Functional Assessment Average Pain 7   In the last MONTH (on 0-10 scale) has pain interfered with the following?  1. General activity like being  able to carry out your everyday physical activities such as walking, climbing stairs, carrying groceries, or moving a chair?  Rating(8)   +Driver, -BT, -Dye Allergies.

## 2019-12-12 NOTE — Progress Notes (Signed)
Hailey Moody - 74 y.o. female MRN [id number]  Date of birth: [birth year]  Office Visit Note: Visit Date: 12/10/2019 PCP: Deatra James, MD Referred by: Deatra James, MD  Subjective: Chief Complaint  Patient presents with  . Head - Pain   HPI: Hailey Moody is a 75 y.o. female who comes in today For new patient consultation at the request of Dr. Naomie Dean for severe 65-month history of predominantly left upper neck pain with referral into the occiput and over the top of the head and some referral to the base of the neck.  She is here today with her daughter who provides a lot of the history.  She had recently moved here and state to have healthcare and be close to her daughter.  She first saw Dr. Daisy Blossom in early August with new onset of daily persistent left-sided headache and occipital headache and what appeared to be occipital neuralgia.  She was originally thought to have temporal arteritis and went through a biopsy.  She had no prior history of headache or neck pain that was this severe.  On and off history of some neck pain.  She has had no referral down the arms or paresthesias.  Initial pain was debilitating and really 10 out of 10 sharp shooting type pain.  She has undergone occipital field blocks by both Dr. Daisy Blossom and Dr. Darreld Mclean nurse practitioner, Shawnie Dapper, FNP.  Patient reports instantaneous relief with those blocks and over time she still reports that they are not as frequent but are still present and not quite as bad as they were before but still debilitating.  She describes no right-sided symptoms.  She was initially taking some gabapentin but did not tolerate it very well and this was at a very low dose.  The gabapentin she felt like did help however.  Patient has lost some weight and is having difficulty swallowing.  She has had evaluation and ongoing evaluation by ENT Dr. Dillard Cannon.  Her case is complicated by some depression and anxiety and she is on Zoloft as well as BuSpar.   She has taken some hydrocodone.  She has had recent cervical spine MRI and this is an MRI read by Dr. Marjory Lies.  This was read as unremarkable except for small dural based cerebellar lesion.  She also had a brain MRI of that day that characterizes that more.  When I reviewed the MRI with the patient today we do see increased cervical lordosis with multilevel facet arthropathy but again no cord compression or nerve compression etc.  Review of Systems  Constitutional: Positive for weight loss.  HENT:       Swallowing difficulties  Musculoskeletal: Positive for neck pain.  Neurological: Positive for headaches.  All other systems reviewed and are negative.  Otherwise per HPI.  Assessment & Plan: Visit Diagnoses:  1. Cervicalgia   2. Cervical spondylosis without myelopathy   3. Occipital neuralgia of left side     Plan: Findings:  Chronic 80-month history of initial onset acutely of left occipital neuralgia and upper cervical neck pain.  This has been relieved temporarily with occipital nerve field blocks and still ongoing reduction in frequency and severity but still overall debilitating.  This is all left side without right side.  MRI showing significant facet arthropathy throughout the cervical spine but without cord compression or stenosis.  Significant forward flexion with cervical lordosis.  Exam shows pain with extension and facet loading of the cervical spine as well as multiple trigger  points in the left splenius capitis and paraspinal musculature as well as levator scapula and trapezius.  I do think it would be reasonable to look at diagnostic left C2-3 and possibly C3-4 cervical facet joint blocks and third occipital nerve block.  If this gave her a lot of relief we could look at double diagnostic block and radiofrequency ablation.  This was gone over at length with her and her daughter.  We spent more than 45 minutes discussing headache and cervical spine pain and potential causes of her  symptoms.  We also talked about myofascial pain syndrome and trigger points which I think may be a secondary problem.  We will schedule her for left C2-3 and third occipital nerve block.  This would be done with a pain diary and using fluoroscopic guidance.    Meds & Orders: No orders of the defined types were placed in this encounter.  No orders of the defined types were placed in this encounter.   Follow-up: No follow-ups on file.   Procedures: No procedures performed  No notes on file   Clinical History: MR CERVICAL SPINE W WO CONTRAST  TECHNIQUE: MRI of the cervical spine was obtained utilizing 3 mm sagittal slices from the posterior fossa down to the T3-4 level with T1, T2 and inversion recovery views. In addition 4 mm axial slices from C2-3 down to T1-2 level were included with T2 and gradient echo views. CONTRAST: 80ml multihance  COMPARISON: none  IMAGING SITE: [facility] [address] (1.5 Tesla MRI)    FINDINGS:   On sagittal views the vertebral bodies have normal height and alignment.  The spinal cord is normal in size and appearance. The posterior fossa, pituitary gland and paraspinal soft tissues are unremarkable.    On axial views there is no spinal stenosis or foraminal narrowing.   Limited views of the soft tissues of the head and neck are notable for small dural based enhancing left inferior cerebellar lesion, may represent small meningioma.    IMPRESSION:   MRI cervical spine with and without contrast demonstrating: - Small dural based enhancing left inferior cerebellar lesion, may represent small meningioma; see MRI head from same day. - Cervical spine otherwise unremarkable.     INTERPRETING PHYSICIAN:  Suanne Marker, MD Certified in Neurology, Neurophysiology and Neuroimaging  [facility] [address] [place], [place] [address] [phone]   She reports that she has been smoking  cigarettes. She started smoking about 61 years ago. She has a 30.00 pack-year smoking history. She has never used smokeless tobacco. No results for input(s): HGBA1C, LABURIC in the last 8760 hours.  Objective:  VS:  HT:    WT:   BMI:     BP:(!) 163/78  HR:90bpm  TEMP: ( )  RESP:  Physical Exam Vitals and nursing note reviewed.  Constitutional:      General: She is not in acute distress.    Appearance: Normal appearance. She is not ill-appearing.     Comments: Thin appearing  HENT:     Head: Normocephalic and atraumatic.     Right Ear: External ear normal.     Left Ear: External ear normal.  Eyes:     Extraocular Movements: Extraocular movements intact.  Cardiovascular:     Rate and Rhythm: Normal rate.     Pulses: Normal pulses.  Musculoskeletal:     Cervical back: Neck supple. Tenderness present. No rigidity.     Right lower leg: No edema.  Left lower leg: No edema.     Comments: Inspection reveals forward flexed cervical spine.  Patient has good strength in the upper extremities including 5 out of 5 strength in wrist extension long finger flexion and APB.  There is no atrophy of the hands intrinsically.  There is a negative Hoffmann's test.  She does have some pain around the occipital nerves on the left more than right with the equivocally positive Tinel's.  She has pain with palpation lightly even in the paraspinal musculature in the upper cervical spine on the left.  What is felt to be positive trigger point in the sternocleidomastoid insertion.  She has trigger points in the levator scapula and trapezius as well.  She has some mild shoulder impingement bilaterally.   Lymphadenopathy:     Cervical: No cervical adenopathy.  Skin:    Findings: No erythema, lesion or rash.  Neurological:     General: No focal deficit present.     Mental Status: She is alert and oriented to person, place, and time.     Sensory: No sensory deficit.     Motor: No weakness or abnormal muscle  tone.     Coordination: Coordination normal.  Psychiatric:        Mood and Affect: Mood normal.        Behavior: Behavior normal.     Ortho Exam  Imaging: No results found.  Past Medical/Family/Surgical/Social History: Medications & Allergies reviewed per EMR, new medications updated. Patient Active Problem List   Diagnosis Date Noted  . New daily persistent headache 11/06/2019  . Dysphagia 11/06/2019  . Cervical radiculopathy 11/06/2019  . Cervico-occipital neuralgia 11/06/2019  . Cervicalgia 11/06/2019  . Chronic neck pain 11/06/2019   Past Medical History:  Diagnosis Date  . Arthritis   . Borderline diabetes   . COPD (chronic obstructive pulmonary disease) (HCC)    on spiriva, nebulizer  . Essential hypertension   . Fatigue    unspecified  . Heart attack (HCC)    "mild"  . Hyperlipidemia   . Hypothyroidism   . Mini stroke (HCC)   . Vitamin D deficiency    Family History  Problem Relation Age of Onset  . Diabetes type I Child   . Stroke Brother   . Heart attack Mother   . Heart attack Father    Past Surgical History:  Procedure Laterality Date  . temporal artery biopsy Left 10/02/2019   Social History   Occupational History  . Not on file  Tobacco Use  . Smoking status: Current Every Day Smoker    Packs/day: 0.50    Years: 60.00    Pack years: 30.00    Types: Cigarettes    Start date: 31  . Smokeless tobacco: Never Used  Substance and Sexual Activity  . Alcohol use: Not Currently  . Drug use: Never  . Sexual activity: Not on file

## 2019-12-16 ENCOUNTER — Other Ambulatory Visit: Payer: Self-pay

## 2019-12-16 ENCOUNTER — Encounter: Payer: Self-pay | Admitting: Internal Medicine

## 2019-12-16 ENCOUNTER — Ambulatory Visit: Payer: Medicare PPO | Admitting: Internal Medicine

## 2019-12-16 VITALS — BP 120/80 | HR 99 | Temp 96.6°F | Ht 62.0 in | Wt 84.0 lb

## 2019-12-16 DIAGNOSIS — I10 Essential (primary) hypertension: Secondary | ICD-10-CM | POA: Diagnosis not present

## 2019-12-16 DIAGNOSIS — R5383 Other fatigue: Secondary | ICD-10-CM

## 2019-12-16 MED ORDER — AMLODIPINE BESYLATE 10 MG PO TABS: 10.0000 mg | ORAL_TABLET | Freq: Once | ORAL | 3 refills | Status: DC

## 2019-12-16 MED ORDER — AMLODIPINE BESYLATE 10 MG PO TABS
10.0000 mg | ORAL_TABLET | Freq: Every day | ORAL | 3 refills | Status: DC
Start: 2019-12-16 — End: 2019-12-25

## 2019-12-16 NOTE — Patient Instructions (Signed)
Medication Instructions:  INCREASE AMLODIPINE TO 10mg  (1 tablet DAILY) *If you need a refill on your cardiac medications before your next appointment, please call your pharmacy*  Lab Work: None Ordered At This Time.  If you have labs (blood work) drawn today and your tests are completely normal, you will receive your results only by: MyChart Message (if you have MyChart) OR . A paper copy in the mail If you have any lab test that is abnormal or we need to change your treatment, we will call you to review the results.  Testing/Procedures:  Your physician has requested that you have an echocardiogram. Echocardiography is a painless test that uses sound waves to create images of your heart. It provides your doctor with information about the size and shape of your heart and how well your heart's chambers and valves are working. You may receive an ultrasound enhancing agent through an IV if needed to better visualize your heart during the echo.This procedure takes approximately one hour. There are no restrictions for this procedure. This will take place at the [address].   Follow-Up: At [facility], you and your health needs are our priority.  As part of our continuing mission to provide you with exceptional heart care, we have created designated Provider Care Teams.  These Care Teams include your primary Cardiologist (physician) and Advanced Practice Providers (APPs -  Physician Assistants and Nurse Practitioners) who all work together to provide you with the care you need, when you need it.  Your next appointment:   1 month(s)  The format for your next appointment:   In Person  Provider:   [facility], MD

## 2019-12-16 NOTE — Telephone Encounter (Signed)
Called pt and ivm #1 to sch second appt.

## 2019-12-16 NOTE — Progress Notes (Signed)
Cardiology Office Note:    Date:  12/16/2019   ID:  Hailey Moody, DOB 07-03-1945, MRN [id number]  PCP:  Hailey James, MD  Cardiologist:  No primary care provider on file.  Electrophysiologist:  None   Referring MD: Hailey James, MD   Chief Complaint/Reason for Referral: Establish care, review HTN  History of Present Illness:    Hailey Moody is a 74 y.o. female with a history of hypothyroidism, severe headaches, HTN, HLD, fatigue and vit D deficiency. Current smoker, long term. Osteoporosis by recent dexa scan.  Describes new daily headaches starting at the end of May - "horrible headaches". Screened for temporal arteritis with left sided biopsy, patient tells me this was negative. During this procedure her blood pressure "was so high" that she was recommended to see a specialist. She is followed locally by Dr. Lucia Moody in neurology. She recently moved from [place] and is establishing care in cardiology. Blood pressure still running high at home, despite normal reading in office today.   She feels cold and is wearing a blanket from home during our visit. She has no energy and has severe fatigue. Describes weight loss due to dysphagia. We discussed lung cancer screening which is being undertaken by her primary physician per her report.   She has mild pedal edema. Queried low albumin but lab from November 06, 2019 shows normal albumin and total protein.   The patient denies chest pain, chest pressure, dyspnea at rest, palpitations, PND, orthopnea. Denies cough, fever, chills Denies syncope or presyncope. Denies dizziness or lightheadedness.    Past Medical History:  Diagnosis Date  . Arthritis   . Borderline diabetes   . COPD (chronic obstructive pulmonary disease) (HCC)    on spiriva, nebulizer  . Essential hypertension   . Fatigue    unspecified  . Heart attack (HCC)    "mild"  . Hyperlipidemia   . Hypothyroidism   . Mini stroke (HCC)   . Vitamin D deficiency     Past Surgical  History:  Procedure Laterality Date  . temporal artery biopsy Left 10/02/2019    Current Medications: Current Meds  Medication Sig  . albuterol (ACCUNEB) 1.25 MG/3ML nebulizer solution Take 3 mLs by nebulization. Three times per day as needed.  . Albuterol Sulfate (PROAIR HFA IN) Inhale 2 puffs into the lungs. Every 4-6 hours as needed.  Marland Kitchen amLODipine (NORVASC) 5 MG tablet Take 5 mg by mouth daily as needed.  . busPIRone HCl (BUSPAR PO) Take by mouth.  . Calcium Carbonate-Vitamin D3 (CALCIUM 600/VITAMIN D) 600-400 MG-UNIT TABS Take 1 tablet by mouth in the morning and at bedtime.  . ergocalciferol (VITAMIN D2) 1.25 MG (50000 UT) capsule Take 50,000 Units by mouth once a week. On Tuesdays  . gabapentin (NEURONTIN) 100 MG capsule Take 1 capsule (100 mg total) by mouth 3 (three) times daily.  Marland Kitchen HYDROcodone-acetaminophen (NORCO/VICODIN) 5-325 MG tablet Take 1 tablet by mouth every 8 (eight) hours.   Marland Kitchen levothyroxine (SYNTHROID) 125 MCG tablet Take 125 mcg by mouth daily.  Marland Kitchen losartan (COZAAR) 100 MG tablet Take 100 mg by mouth daily.  . metoprolol tartrate (LOPRESSOR) 25 MG tablet Take 25 mg by mouth daily.  Marland Kitchen omeprazole (PRILOSEC) 20 MG capsule Take 20 mg by mouth daily.  . sertraline (ZOLOFT) 100 MG tablet Take 100 mg by mouth daily.   Marland Kitchen SPIRIVA HANDIHALER 18 MCG inhalation capsule Place 1 capsule into inhaler and inhale daily.   Current Facility-Administered Medications for the 12/16/19 encounter (Office Visit) with  Parke Poisson, MD  Medication  . bupivacaine (MARCAINE) 0.5 % (with pres) injection 3 mL  . methylPREDNISolone acetate (DEPO-MEDROL) injection 80 mg     Allergies:   Penicillins   Social History   Tobacco Use  . Smoking status: Current Every Day Smoker    Packs/day: 0.50    Years: 60.00    Pack years: 30.00    Types: Cigarettes    Start date: 53  . Smokeless tobacco: Never Used  Substance Use Topics  . Alcohol use: Not Currently  . Drug use: Never      Family History: The patient's family history includes Diabetes type I in her child; Heart attack in her father and mother; Stroke in her brother.  ROS:   Please see the history of present illness.    All other systems reviewed and are negative.  EKGs/Labs/Other Studies Reviewed:    The following studies were reviewed today:  EKG:  NSR, LVH   Recent Labs: 11/06/2019: ALT 11; BUN 16; Creatinine, Ser 0.87; Hemoglobin 13.2; Platelets 486; Potassium 4.3; Sodium 129; TSH 20.800  -reviewed. Recent Lipid Panel No results found for: CHOL, TRIG, HDL, CHOLHDL, VLDL, LDLCALC, LDLDIRECT  Physical Exam:    VS:  BP 120/80   Pulse 99   Temp (!) 96.6 F (35.9 C)   Ht 5\' 2"  (1.575 m)   Wt 84 lb (38.1 kg)   SpO2 99%   BMI 15.36 kg/m     Wt Readings from Last 5 Encounters:  12/16/19 84 lb (38.1 kg)  11/26/19 87 lb (39.5 kg)  11/06/19 92 lb (41.7 kg)    Constitutional: No acute distress Eyes: sclera non-icteric, normal conjunctiva and lids ENMT: normal dentition, moist mucous membranes Cardiovascular: regular rhythm, normal rate, no murmurs. S1 and S2 normal. Radial pulses normal bilaterally. No jugular venous distention.  Respiratory: clear to auscultation bilaterally GI : normal bowel sounds, soft and nontender. No distention.   MSK: extremities warm, well perfused. No edema.  NEURO: grossly nonfocal exam, moves all extremities. PSYCH: alert and oriented x 3, normal mood and affect.   ASSESSMENT:    1. Fatigue, unspecified type   2. Hypertension, unspecified type    PLAN:    Fatigue, unspecified type - Plan: EKG 12-Lead, ECHOCARDIOGRAM COMPLETE  Hypertension, unspecified type - Plan: EKG 12-Lead, ECHOCARDIOGRAM COMPLETE   Will need to increase antihypertensive therapy based on home BP readings. Will reviewed at follow up. She has been on amlodipine for years and it is unlikely that her LE swelling is from CCB. I will increase dose of amlodipine at this time to 10 mg daily  and monitor for BP effect. Counseled on medication side effects.   Would like to obtain an echocardiogram with LE edema, severe fatigue, significant HTN. Extremities cool today, though does not appear to be in decompensated HF.   Weight loss and dysphagia should be evaluated further in setting of current smoking. Patient strongly encouraged to quit, for improvement in BP.   Medical history reviewed in detail with daugther present in the room who provides much of the collaborative history.   Total time of encounter: 45 minutes total time of encounter, including 30 minutes spent in face-to-face patient care on the date of this encounter. This time includes coordination of care and counseling regarding above mentioned problem list. Remainder of non-face-to-face time involved reviewing chart documents/testing relevant to the patient encounter and documentation in the medical record. I have independently reviewed documentation from referring provider. 10 pages of  outside records reviewed in conjunction with consultation from provider in [place].  Weston Brass, MD [place]  [facility]    Medication Adjustments/Labs and Tests Ordered: Current medicines are reviewed at length with the patient today.  Concerns regarding medicines are outlined above.   Orders Placed This Encounter  Procedures  . EKG 12-Lead  . ECHOCARDIOGRAM COMPLETE    Meds ordered this encounter  Medications  . DISCONTD: amLODipine (NORVASC) 10 MG tablet    Sig: Take 1 tablet (10 mg total) by mouth once for 1 dose.    Dispense:  30 tablet    Refill:  3  . amLODipine (NORVASC) 10 MG tablet    Sig: Take 1 tablet (10 mg total) by mouth daily.    Dispense:  30 tablet    Refill:  3    Patient Instructions  Medication Instructions:  INCREASE AMLODIPINE TO 10mg  (1 tablet DAILY) *If you need a refill on your cardiac medications before your next appointment, please call your pharmacy*  Lab Work: None Ordered At  This Time.  If you have labs (blood work) drawn today and your tests are completely normal, you will receive your results only by: MyChart Message (if you have MyChart) OR . A paper copy in the mail If you have any lab test that is abnormal or we need to change your treatment, we will call you to review the results.  Testing/Procedures:  Your physician has requested that you have an echocardiogram. Echocardiography is a painless test that uses sound waves to create images of your heart. It provides your doctor with information about the size and shape of your heart and how well your heart's chambers and valves are working. You may receive an ultrasound enhancing agent through an IV if needed to better visualize your heart during the echo.This procedure takes approximately one hour. There are no restrictions for this procedure. This will take place at the [address].   Follow-Up: At [facility], you and your health needs are our priority.  As part of our continuing mission to provide you with exceptional heart care, we have created designated Provider Care Teams.  These Care Teams include your primary Cardiologist (physician) and Advanced Practice Providers (APPs -  Physician Assistants and Nurse Practitioners) who all work together to provide you with the care you need, when you need it.  Your next appointment:   1 month(s)  The format for your next appointment:   In Person  Provider:   [facility], MD

## 2019-12-18 ENCOUNTER — Encounter: Payer: Self-pay | Admitting: Physical Therapy

## 2019-12-18 ENCOUNTER — Ambulatory Visit: Payer: Medicare PPO | Attending: Neurology | Admitting: Physical Therapy

## 2019-12-18 ENCOUNTER — Other Ambulatory Visit: Payer: Self-pay

## 2019-12-18 ENCOUNTER — Other Ambulatory Visit (INDEPENDENT_AMBULATORY_CARE_PROVIDER_SITE_OTHER): Payer: Self-pay

## 2019-12-18 DIAGNOSIS — M25511 Pain in right shoulder: Secondary | ICD-10-CM | POA: Insufficient documentation

## 2019-12-18 DIAGNOSIS — R1314 Dysphagia, pharyngoesophageal phase: Secondary | ICD-10-CM | POA: Diagnosis present

## 2019-12-18 DIAGNOSIS — M62838 Other muscle spasm: Secondary | ICD-10-CM | POA: Insufficient documentation

## 2019-12-18 DIAGNOSIS — M542 Cervicalgia: Secondary | ICD-10-CM | POA: Diagnosis present

## 2019-12-18 DIAGNOSIS — R1319 Other dysphagia: Secondary | ICD-10-CM

## 2019-12-18 DIAGNOSIS — M25611 Stiffness of right shoulder, not elsewhere classified: Secondary | ICD-10-CM | POA: Insufficient documentation

## 2019-12-18 NOTE — Patient Instructions (Addendum)
Access Code: [email or web address] URL: [email or web address] Date: 12/18/2019 Prepared by: Raynelle Fanning  Exercises Sternocleidomastoid Stretch - 2 x daily - 7 x weekly - 1 sets - 3 reps - 30 hold Seated Scapular Retraction - 2 x daily - 7 x weekly - 1-3 sets - 10 reps - 2-3 sec hold Standing Scapular Depression - 2 x daily - 7 x weekly - 1 sets - 10 reps   Trigger Point Dry Needling   What is Trigger Point Dry Needling (DN)? o DN is a physical therapy technique used to treat muscle pain and dysfunction. Specifically, DN helps deactivate muscle trigger points (muscle knots).  o A thin filiform needle is used to penetrate the skin and stimulate the underlying trigger point. The goal is for a local twitch response (LTR) to occur and for the trigger point to relax. No medication of any kind is injected during the procedure.    What Does Trigger Point Dry Needling Feel Like?  o The procedure feels different for each individual patient. Some patients report that they do not actually feel the needle enter the skin and overall the process is not painful. Very mild bleeding may occur. However, many patients feel a deep cramping in the muscle in which the needle was inserted. This is the local twitch response.    How Will I feel after the treatment? o Soreness is normal, and the onset of soreness may not occur for a few hours. Typically this soreness does not last longer than two days.  o Bruising is uncommon, however; ice can be used to decrease any possible bruising.  o In rare cases feeling tired or nauseous after the treatment is normal. In addition, your symptoms may get worse before they get better, this period will typically not last longer than 24 hours.    What Can I do After My Treatment? o Increase your hydration by drinking more water for the next 24 hours. o You may place ice or heat on the areas treated that have become sore, however, do not use heat on inflamed or bruised areas. Heat  often brings more relief post needling. o You can continue your regular activities, but vigorous activity is not recommended initially after the treatment for 24 hours. o DN is best combined with other physical therapy such as strengthening, stretching, and other therapies.

## 2019-12-18 NOTE — Telephone Encounter (Signed)
Pt has been sch for 01/07/20, pt needs an Auth [id number].

## 2019-12-18 NOTE — Therapy (Signed)
[facility] [address], [address] [place], [place], [address] Phone: [phone]   Fax:  [phone]  Physical Therapy Evaluation  Patient Details  Name: Hailey Moody MRN: [id number] Date of Birth: 09/15/1945 Referring Provider (PT): Naomie Dean MD   Encounter Date: 12/18/2019   PT End of Session - 12/18/19 1400    Visit Number 1    Number of Visits 12    Date for PT Re-Evaluation 01/29/20    Authorization Type [employer]    Authorization Time Period 12/18/19-01/29/20    Authorization - Visit Number 1    Authorization - Number of Visits 12    Progress Note Due on Visit 10    PT Start Time 1402    PT Stop Time 1447    PT Time Calculation (min) 45 min    Activity Tolerance Patient tolerated treatment well;Treatment limited secondary to medical complications (Comment)   patient felt dizzy after seated shrugs/depression and had to lie down x 5 min          Past Medical History:  Diagnosis Date  . Arthritis   . Borderline diabetes   . COPD (chronic obstructive pulmonary disease) (HCC)    on spiriva, nebulizer  . Essential hypertension   . Fatigue    unspecified  . Heart attack (HCC)    "mild"  . Hyperlipidemia   . Hypothyroidism   . Mini stroke (HCC)   . Vitamin D deficiency     Past Surgical History:  Procedure Laterality Date  . temporal artery biopsy Left 10/02/2019    There were no vitals filed for this visit.    Subjective Assessment - 12/18/19 1407    Subjective Patient c/o neck pain beginning about 3 months ago. She has pain with all motions. Patient had a nerve block on 12/12/19 for HA and plans to have the next one in 2-3 weeks. This has helped. She is here today for the trigger points her MD has found. If she's sitting she must have a pillow behind her neck. She also reports her right shoulder is in pain and she can't use it to even pick up a cup of coffee. Patient having fatigue and is on a walker  due to this. She had an incident with her throat which is limiting what she can eat.    Patient is accompained by: Family member   daughter   Pertinent History OA, COPD, HTN, acute fatigue, HA, swallowing difficulties    Diagnostic tests CT - increased lordosis c spine; xrays shoulder - degeneration    Patient Stated Goals to get rid of tension and pain in neck and be able to use my shoulder    Currently in Pain? Yes    Pain Score 7     Pain Location Neck    Pain Orientation Posterior    Pain Descriptors / Indicators Aching    Pain Type Acute pain    Pain Onset More than a month ago    Pain Frequency Constant    Aggravating Factors  movement    Pain Relieving Factors support with pillow    Multiple Pain Sites Yes    Pain Score 8   10/10 with use   Pain Location Shoulder    Pain Orientation Right    Pain Descriptors / Indicators Aching    Pain Type Chronic pain    Pain Onset More than a month ago    Pain Frequency Constant    Aggravating Factors  movement              [facility] PT Assessment - 12/18/19 0001      Assessment   Medical Diagnosis M54.2,G89.29 (ICD-10-CM) - Chronic neck pain, M79.18, MPS cervical; M54.2cervicalgia, 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  No    Is the patient reluctant to leave their home because of a fear of falling?  No      Home Environment   Living Environment Private residence    Living Arrangements Children    Available Help at Discharge Family      Prior Function   Level of Independence Independent    Vocation Retired    Leisure playing with great  grandchildren, being active cooking/cleaning      Cognition   Overall Cognitive Status Within Functional Limits for tasks assessed      Observation/Other Assessments   Focus on Therapeutic Outcomes (FOTO)  to be completed next visit due to time constraints      Posture/Postural Control   Posture/Postural Control Postural limitations    Postural Limitations Rounded Shoulders;Forward head;Increased thoracic kyphosis;Posterior pelvic tilt      ROM / Strength   AROM / PROM / Strength AROM;Strength      AROM   Overall AROM Comments pain in all directions, Right shoulder flex less than 45 deg; ER and ext WFL; IR to stomach in sitting marked pain    AROM Assessment Site Cervical    Cervical Flexion 22    Cervical Extension 24    Cervical - Right Side Bend 20    Cervical - Left Side Bend 15    Cervical - Right Rotation 45    Cervical - Left Rotation 45      Strength   Overall Strength Comments flex/left SB 4-/5; else 4/5; right shoulder flex and IR weak and painful, ER 3+/5 no pain, ext 4/5, flex with elbow at 90 at side marked pain      Palpation   Palpation comment left SCM, UT, bil paraspinals thoracid; suboccipitals; right infraspinatus, pectoralis, UT, subscap insertion                      Objective measurements completed on examination: See above findings.               PT Education - 12/18/19 1555    Education Details HEP; DN education    Person(s) Educated Patient    Methods Explanation;Demonstration;Handout    Comprehension Verbalized understanding;Returned demonstration            PT Short Term Goals - 12/18/19 1618      PT SHORT TERM GOAL #1   Title Ind with initial HEP    Time 2    Period Weeks    Status New    Target Date 01/01/20             PT Long Term Goals - 12/18/19 1619      PT  LONG TERM GOAL #1   Title decreased pain in neck by 50% with motion    Time 6    Period Weeks    Status New    Target Date 01/29/20       PT LONG TERM GOAL #2   Title Visible and palpable decrease in tension of left SCM and UT to help reduce neck pain.    Time 6    Period Weeks    Status New      PT LONG TERM GOAL #3   Title Improved neck strength to 4/5 or better in all planes.    Time 6    Period Weeks    Status New      PT LONG TERM GOAL #4   Title Patient able to sleep without waking from neck pain    Time 6    Period Weeks    Status New                  Plan - 12/18/19 1610    Clinical Impression Statement Patient is a very pleasant 74 y.o female with neck pain x 3 months. She has had 1/2 nerve blocks with relief of HA. She has pain with all cervical motions and is limited with ROM. She has marked tightness in left UT and SCM and tightness in other cervical muscles as well. She also c/o right shoulder pain and is unable to use the right arm for ADLS. She uses a RW at this time due to fatigue and other medical issues (see chart). She has marked pain, weakness and limitations in the right shoulder. She will benefit from PT to address these deficits. She experienced dizziness after performing seated shoulder elevation and depression and had to lie down for 5 min. She felt better after and was able to complete session without incident. FOTO was not completed due to lack of time.    Personal Factors and Comorbidities Age;Comorbidity 3+    Comorbidities OA, COPD, HTN, acute fatigue, HA, swallowing difficulties, MI    Examination-Activity Limitations Carry;Dressing;Lift;Reach Overhead;Sleep    Examination-Participation Restrictions Cleaning;Meal Prep;Other   playing with grandchildren   Stability/Clinical Decision Making Evolving/Moderate complexity    Clinical Decision Making Moderate    Rehab Potential Good    PT Frequency 2x / week    PT Duration 6 weeks    PT Treatment/Interventions ADLs/Self Care Home Management;Cryotherapy;Electrical Stimulation;Iontophoresis 4mg /ml Dexamethasone;Moist Heat;Neuromuscular  re-education;Therapeutic exercise;Therapeutic activities;Patient/family education;Manual techniques;Dry needling;Taping;Passive range of motion    PT Next Visit Plan FOTO and set goal please: Neck: DN to left SCM, bil UT, bil suboccipitals, cspine; isometrics, postural strength.  If approved address shoulder as well (add goals)    PT Home Exercise Plan    Consulted and Agree with Plan of Care Patient;Family member/caregiver           Patient will benefit from skilled therapeutic intervention in order to improve the following deficits and impairments:  Decreased range of motion, Pain, Impaired UE functional use, Increased muscle spasms, Impaired flexibility, Decreased strength, Postural dysfunction  Visit Diagnosis: Cervicalgia - Plan: PT plan of care cert/re-cert  Acute pain of right shoulder - Plan: PT plan of care cert/re-cert  Stiffness of right shoulder, not elsewhere classified - Plan: PT plan of care cert/re-cert  Other muscle spasm - Plan: PT plan of care cert/re-cert     Problem List Patient Active Problem List   Diagnosis Date Noted  . New daily persistent headache 11/06/2019  . Dysphagia  11/06/2019  . Cervical radiculopathy 11/06/2019  . Cervico-occipital neuralgia 11/06/2019  . Cervicalgia 11/06/2019  . Chronic neck pain 11/06/2019    Solon Palm PT 12/18/2019, 4:25 PM  [facility] [address], [address] [place], [place], [address] Phone: [phone]   Fax:  [phone]  Name: Hailey Moody MRN: [id number] Date of Birth: 1945-06-21

## 2019-12-19 NOTE — Telephone Encounter (Signed)
Pt has been approve for [id number] Auth# [id number].

## 2019-12-20 NOTE — Procedures (Signed)
Diagnostic Cervical Facet Joint Nerve Block with Fluoroscopic Guidance  Patient: Hailey Moody      Date of Birth: 1946/03/21 MRN: [id number] PCP: Deatra James, MD      Visit Date: 12/12/2019   Universal Protocol:    Date/Time: 09/24/215:48 AM  Consent Given By: the patient  Position: LATERAL  Additional Comments: Vital signs were monitored before and after the procedure. Patient was prepped and draped in the usual sterile fashion. The correct patient, procedure, and site was verified.   Injection Procedure Details:  Procedure Site One Meds Administered:  Meds ordered this encounter  Medications  . bupivacaine (MARCAINE) 0.5 % (with pres) injection 3 mL  . methylPREDNISolone acetate (DEPO-MEDROL) injection 80 mg     Laterality: Left  Location/Site:  C2-3  Needle size: 25 G  Needle type: Standard  Needle Placement: Articular Pillar  Findings:  -Contrast Used: 0.5 mL iohexol 180 mg iodine/mL   -Comments: Excellent flow of contrast across the articular pillars without intravascular flow  Procedure Details: The fluoroscope beam was manipulated to achieve the best "true" lateral view possible by squaring off the endplates with cranial and caudal tilt and using varying obliquity to achieve the a view with the longest length of spinous process.  The region overlying the facet joints mentioned above were then localized under fluoroscopic visualization.  The needle was inserted down to the center of the "trapezoid" outline of the facet joint lateral mass. Bi-planar images were used for confirming placement and spot radiographs were documented.  A 0.25 ml volume of Omnipaque-240 was injected to look for vascular uptake. A 0.5 ml. volume of the anesthetic solution was injected onto the target. This procedure was repeated for each medial branch nerve injected.  Prior to the procedure, the patient was given a Pain Diary which was completed for baseline measurements.  After the  procedure, the patient rated their pain every 30 minutes and will continue rating at this frequency for a total of 5 hours.  The patient has been asked to complete the Diary and return to [place] by mail, fax or hand delivered as soon as possible.   Additional Comments:  The patient tolerated the procedure well Dressing: Band-Aid    Post-procedure details: Patient was observed during the procedure. Post-procedure instructions were reviewed.  Patient left the clinic in stable condition.

## 2019-12-20 NOTE — Progress Notes (Signed)
Hailey Moody - 74 y.o. female MRN [id number]  Date of birth: 1945-06-27  Office Visit Note: Visit Date: 12/12/2019 PCP: Deatra James, MD Referred by: Deatra James, MD  Subjective: Chief Complaint  Patient presents with  . Neck - Pain   HPI:  [name] is a 74 y.o. female who comes in today at the request of Dr. Naaman Plummer for planned Left C2-3 Cervical facet/medial branch block with fluoroscopic guidance.  The patient has failed conservative care including home exercise, medications, time and activity modification.  This injection will be diagnostic and hopefully therapeutic.  Please see requesting physician notes for further details and justification.  Exam has shown concordant pain with facet joint loading.  Referring: Shawnie Dapper, FNP and Naomie Dean, MD  MRI reviewed with images and spine model.  MRI reviewed in the note below.   ROS Otherwise per HPI.  Assessment & Plan: Visit Diagnoses:  1. Cervicalgia   2. Cervical spondylosis without myelopathy   3. Occipital neuralgia of left side     Plan: No additional findings.   Meds & Orders:  Meds ordered this encounter  Medications  . bupivacaine (MARCAINE) 0.5 % (with pres) injection 3 mL  . methylPREDNISolone acetate (DEPO-MEDROL) injection 80 mg    Orders Placed This Encounter  Procedures  . Nerve Block  . XR C-ARM NO REPORT    Follow-up: Return for Review Pain Diary.   Procedures: No procedures performed  Diagnostic Cervical Facet Joint Nerve Block with Fluoroscopic Guidance  Patient: Hailey Moody      Date of Birth: Aug 16, 1945 MRN: [id number] PCP: Deatra James, MD      Visit Date: 12/12/2019   Universal Protocol:    Date/Time: 09/24/215:48 AM  Consent Given By: the patient  Position: LATERAL  Additional Comments: Vital signs were monitored before and after the procedure. Patient was prepped and draped in the usual sterile fashion. The correct patient, procedure, and site was verified.   Injection  Procedure Details:  Procedure Site One Meds Administered:  Meds ordered this encounter  Medications  . bupivacaine (MARCAINE) 0.5 % (with pres) injection 3 mL  . methylPREDNISolone acetate (DEPO-MEDROL) injection 80 mg     Laterality: Left  Location/Site:  C2-3  Needle size: 25 G  Needle type: Standard  Needle Placement: Articular Pillar  Findings:  -Contrast Used: 0.5 mL iohexol 180 mg iodine/mL   -Comments: Excellent flow of contrast across the articular pillars without intravascular flow  Procedure Details: The fluoroscope beam was manipulated to achieve the best "true" lateral view possible by squaring off the endplates with cranial and caudal tilt and using varying obliquity to achieve the a view with the longest length of spinous process.  The region overlying the facet joints mentioned above were then localized under fluoroscopic visualization.  The needle was inserted down to the center of the "trapezoid" outline of the facet joint lateral mass. Bi-planar images were used for confirming placement and spot radiographs were documented.  A 0.25 ml volume of Omnipaque-240 was injected to look for vascular uptake. A 0.5 ml. volume of the anesthetic solution was injected onto the target. This procedure was repeated for each medial branch nerve injected.  Prior to the procedure, the patient was given a Pain Diary which was completed for baseline measurements.  After the procedure, the patient rated their pain every 30 minutes and will continue rating at this frequency for a total of 5 hours.  The patient has been asked to complete the Diary and  return to [place] by mail, fax or hand delivered as soon as possible.   Additional Comments:  The patient tolerated the procedure well Dressing: Band-Aid    Post-procedure details: Patient was observed during the procedure. Post-procedure instructions were reviewed.  Patient left the clinic in stable condition.      Clinical  History: MR CERVICAL SPINE W WO CONTRAST  TECHNIQUE: MRI of the cervical spine was obtained utilizing 3 mm sagittal slices from the posterior fossa down to the T3-4 level with T1, T2 and inversion recovery views. In addition 4 mm axial slices from C2-3 down to T1-2 level were included with T2 and gradient echo views. CONTRAST: 25ml multihance  COMPARISON: none  IMAGING SITE: [employer] [address] (1.5 Tesla MRI)    FINDINGS:   On sagittal views the vertebral bodies have normal height and alignment.  The spinal cord is normal in size and appearance. The posterior fossa, pituitary gland and paraspinal soft tissues are unremarkable.    On axial views there is no spinal stenosis or foraminal narrowing.   Limited views of the soft tissues of the head and neck are notable for small dural based enhancing left inferior cerebellar lesion, may represent small meningioma.    IMPRESSION:   MRI cervical spine with and without contrast demonstrating: - Small dural based enhancing left inferior cerebellar lesion, may represent small meningioma; see MRI head from same day. - Cervical spine otherwise unremarkable.     INTERPRETING PHYSICIAN:  Suanne Marker, MD Certified in Neurology, Neurophysiology and Neuroimaging  [facility] [address] [place], [place] [address] [phone]     Objective:  VS:  HT:    WT:   BMI:     BP:(!) 167/91  HR:(!) 111bpm  TEMP: ( )  RESP:  Physical Exam Vitals and nursing note reviewed.  Constitutional:      General: She is not in acute distress.    Appearance: Normal appearance. She is not ill-appearing.  HENT:     Head: Normocephalic and atraumatic.     Right Ear: External ear normal.     Left Ear: External ear normal.  Eyes:     Extraocular Movements: Extraocular movements intact.  Cardiovascular:     Rate and Rhythm: Normal rate.     Pulses: Normal pulses.  Musculoskeletal:      Cervical back: Tenderness present. No rigidity.     Right lower leg: No edema.     Left lower leg: No edema.     Comments: Patient has good strength in the upper extremities including 5 out of 5 strength in wrist extension long finger flexion and APB.  There is no atrophy of the hands intrinsically.  There is a negative Hoffmann's test.   Lymphadenopathy:     Cervical: No cervical adenopathy.  Skin:    Findings: No erythema, lesion or rash.  Neurological:     General: No focal deficit present.     Mental Status: She is alert and oriented to person, place, and time.     Sensory: No sensory deficit.     Motor: No weakness or abnormal muscle tone.     Coordination: Coordination normal.  Psychiatric:        Mood and Affect: Mood normal.        Behavior: Behavior normal.      Imaging: No results found.

## 2019-12-23 ENCOUNTER — Telehealth: Payer: Self-pay | Admitting: Internal Medicine

## 2019-12-23 NOTE — Telephone Encounter (Signed)
Spoke with Hailey Moody, patients daughter who states that her mothers BP was running high, patient was seen at [facility] on 9/20. Her Amlodipine was changed from 5 mg to 10 mg daily at that appointment. Patient has not started the 10 mg yet and has remained on the 5 mg daily until she ran out of pills which is today. Her BP has been much better over the last week. 152/92 is the highest reading she has gotten over the last several days and todays BP is 124/72. Pt is concerned that going up to the 10 mg daily Amlodipine now may cause her BP to be too low. She is requesting a new Rx for the 5 mg daily. She states she can not spilt her 10 mg pills because they are too small. Will send to MD for advisement.

## 2019-12-23 NOTE — Telephone Encounter (Signed)
New message:     Patient daughter calling they have some questions concerning amlodipine. Please call patient daughter back.

## 2019-12-23 NOTE — Telephone Encounter (Signed)
LMTCB regarding Amlodipine questions

## 2019-12-24 ENCOUNTER — Ambulatory Visit
Admission: RE | Admit: 2019-12-24 | Discharge: 2019-12-24 | Disposition: A | Discharge status: Home / Self Care | Payer: Medicare PPO | Source: Ambulatory Visit | Attending: Otolaryngology | Admitting: Otolaryngology

## 2019-12-24 ENCOUNTER — Other Ambulatory Visit: Payer: Medicare PPO

## 2019-12-24 DIAGNOSIS — R1314 Dysphagia, pharyngoesophageal phase: Secondary | ICD-10-CM

## 2019-12-24 MED ORDER — IOPAMIDOL (ISOVUE-300) INJECTION 61%
75.0000 mL | Freq: Once | INTRAVENOUS | Status: AC | PRN
  Administered 2019-12-24: 75 mL via INTRAVENOUS

## 2019-12-25 ENCOUNTER — Ambulatory Visit: Payer: Medicare PPO

## 2019-12-25 ENCOUNTER — Ambulatory Visit: Payer: Medicare PPO | Admitting: Physical Therapy

## 2019-12-25 ENCOUNTER — Other Ambulatory Visit: Payer: Self-pay

## 2019-12-25 ENCOUNTER — Encounter: Payer: Self-pay | Admitting: Physical Therapy

## 2019-12-25 DIAGNOSIS — R1314 Dysphagia, pharyngoesophageal phase: Secondary | ICD-10-CM

## 2019-12-25 DIAGNOSIS — M25511 Pain in right shoulder: Secondary | ICD-10-CM

## 2019-12-25 DIAGNOSIS — M542 Cervicalgia: Secondary | ICD-10-CM | POA: Diagnosis not present

## 2019-12-25 DIAGNOSIS — M25611 Stiffness of right shoulder, not elsewhere classified: Secondary | ICD-10-CM

## 2019-12-25 DIAGNOSIS — M62838 Other muscle spasm: Secondary | ICD-10-CM

## 2019-12-25 MED ORDER — AMLODIPINE BESYLATE 5 MG PO TABS: 5.0000 mg | ORAL_TABLET | Freq: Every day | ORAL | 3 refills | Status: AC

## 2019-12-25 NOTE — Therapy (Signed)
[facility] [address] [place], [place], [address] Phone: [phone]   Fax:  [phone]  Patient Details  Name: Oveta Idris MRN: [id number] Date of Birth: Jul 28, 1945 Referring Provider:  Drema Halon, *  Encounter Date: 12/25/2019    ST - Arrive-Cancel Pt walked with walker back to ST room. Extremely slow gait, and pt with very low volume speech, very likely due to reduced breath support. Scab on lt forehead, above and lateral to lt eye. Pt with "wet" voice as she greeted SLP, pt then had spontaneous weak throat clear x3 and voice was clear after that.  When pt entered ST room she took a very small sip (no more than 1 teaspoon) of water and pt swallowed three times - no overt s/sx of aspiration. However pt reports she is very weak and swallowing is taxing to her.Brooke and her daughter report eating is even more challenging than during modified barium swallow exam on 11-13-19, and pt weight is down to 84 lbs. Pt is currently only having 4-5 sips of high protein, high calorie shake (Ensure, etc) at times during the day and then stops due to "I just can't drink any more."  SLP decided to put ST eval on hold today due to pt inability to participate. To incr swallow strength and prep pt for eventual dysphagia home exercises, SLP told pt to swallow hard at least x10 every hour- first swallowing her saliva, and then with 1/8 t water to get to 10 reps -and more if she can do so.  SLP believes pt needs to see GI in order to assess whether or not esophageal dilation would be appropriate for pt in this case, to decr amount of pharyngeal stasis by allowing more flow through upper esophageal sphincter. This would ultimately incr the pt's bolus clearance through the pharynx,  thus incr'ing her efficiency of swallowing and would lead to less fatigue. Pt could then take more by mouth.  SLP asked daughter Jackelyn Hoehn to contact SLP with the plan  going forward and when pt has strength to participate in ST.   Patient and daughter informed SLP that if the need for ENT services continues they prefer a new referral. SLP offered to inform Dr Lucia Gaskins of the situation today and Joei and daughter said they would appreciate that.   Time spent with pt and daughter in consultation today was 47 minutes.    [facility] ,MS, CCC-SLP  12/25/2019, 4:52 PM  [facility] [address] [place], [place], [address] Phone: [phone]   Fax:  [phone]

## 2019-12-25 NOTE — Telephone Encounter (Signed)
OK to continue amlodipine 5 mg daily.

## 2019-12-25 NOTE — Therapy (Signed)
[facility] [address], [address] [place], [place], [address] Phone: [phone]   Fax:  [phone]  Physical Therapy Treatment  Patient Details  Name: Hailey Moody MRN: [id number] Date of Birth: 28-Dec-1945 Referring Provider (PT): Naomie Dean MD   Encounter Date: 12/25/2019   PT End of Session - 12/25/19 1546    Visit Number 2    Number of Visits 12    Date for PT Re-Evaluation 01/29/20    Authorization Type [employer]    Authorization Time Period 12/18/19-01/29/20    Authorization - Visit Number 2    Authorization - Number of Visits 12    Progress Note Due on Visit 10    PT Start Time 1400    PT Stop Time 1445    PT Time Calculation (min) 45 min    Activity Tolerance Patient tolerated treatment well;No increased pain    Behavior During Therapy WFL for tasks assessed/performed           Past Medical History:  Diagnosis Date  . Arthritis   . Borderline diabetes   . COPD (chronic obstructive pulmonary disease) (HCC)    on spiriva, nebulizer  . Essential hypertension   . Fatigue    unspecified  . Heart attack (HCC)    "mild"  . Hyperlipidemia   . Hypothyroidism   . Mini stroke (HCC)   . Vitamin D deficiency     Past Surgical History:  Procedure Laterality Date  . temporal artery biopsy Left 10/02/2019    There were no vitals filed for this visit.   Subjective Assessment - 12/25/19 1405    Subjective Still have pain    Pertinent History OA, COPD, HTN, acute fatigue, HA, swallowing difficulties    Diagnostic tests CT - increased lordosis c spine; xrays shoulder - degeneration    Patient Stated Goals to get rid of tension and pain in neck and be able to use my shoulder    Currently in Pain? Yes    Pain Score 7     Pain Location Neck    Pain Orientation Posterior    Pain Descriptors / Indicators Aching    Pain Type Acute pain    Pain Onset More than a month ago    Pain Frequency Constant     Aggravating Factors  movement    Pain Relieving Factors support with pillow    Multiple Pain Sites Yes    Pain Score 9    Pain Location Shoulder    Pain Orientation Right    Pain Descriptors / Indicators Aching    Pain Type Chronic pain    Pain Onset More than a month ago    Pain Frequency Constant    Aggravating Factors  movement                             [facility] Adult PT Treatment/Exercise - 12/25/19 0001      Therapeutic Activites    Therapeutic Activities Other Therapeutic Activities    Other Therapeutic Activities dicussed with patient on keeping shoulders down to decrease stress of muscles, chin down to reduce the tension in the suboccitpitals; when she is walking to keep shoulders relaxed and not hang on her arms whlie pressing down on the walker      Manual Therapy   Manual Therapy Soft tissue mobilization    Manual therapy comments to assess for dry needlin    Soft tissue  mobilization bil. upper trap, scalenes, suboccipitals, interscapular in a reclined posture with pillows to support her neck            Trigger Point Dry Needling - 12/25/19 0001    Consent Given? Yes    Education Handout Provided Yes    Muscles Treated Head and Neck Upper trapezius   bil. ant. approach, reclined position   Upper Trapezius Response Twitch reponse elicited;Palpable increased muscle length                PT Education - 12/25/19 1543    Education Details posture in sittng and standing with walking    Person(s) Educated Patient    Methods Explanation;Demonstration    Comprehension Verbalized understanding;Returned demonstration            PT Short Term Goals - 12/18/19 1618      PT SHORT TERM GOAL #1   Title Ind with initial HEP    Time 2    Period Weeks    Status New    Target Date 01/01/20             PT Long Term Goals - 12/18/19 1619      PT LONG TERM GOAL #1   Title decreased pain in neck by 50% with motion    Time 6    Period Weeks      Status New    Target Date 01/29/20      PT LONG TERM GOAL #2   Title Visible and palpable decrease in tension of left SCM and UT to help reduce neck pain.    Time 6    Period Weeks    Status New      PT LONG TERM GOAL #3   Title Improved neck strength to 4/5 or better in all planes.    Time 6    Period Weeks    Status New      PT LONG TERM GOAL #4   Title Patient able to sleep without waking from neck pain    Time 6    Period Weeks    Status New                 Plan - 12/25/19 1547    Clinical Impression Statement Patient felt better after manual work. She was in a reclined position for the dry needling and 2 pillows behind her head. Patient had trigger points in the suboccipitals and interscapular area. After manaual work she did not have as much of a forward head. Patient will hand on her shoulders while walking with a rolling walker. Therapist instructed patient to keep her shoulders down to decrease tension in her neck muscles. Patient is taking blood thiners so need to be careful with dry needling. Patient will benefit from skilled therapy to improve her pain and mobility of her neck.    Personal Factors and Comorbidities Age;Comorbidity 3+    Comorbidities OA, COPD, HTN, acute fatigue, HA, swallowing difficulties, MI    Examination-Activity Limitations Carry;Dressing;Lift;Reach Overhead;Sleep    Examination-Participation Restrictions Cleaning;Meal Prep;Other   playing with grandchildren   Stability/Clinical Decision Making Evolving/Moderate complexity    Rehab Potential Good    PT Frequency 2x / week    PT Duration 6 weeks    PT Treatment/Interventions ADLs/Self Care Home Management;Cryotherapy;Electrical Stimulation;Iontophoresis 4mg /ml Dexamethasone;Moist Heat;Neuromuscular re-education;Therapeutic exercise;Therapeutic activities;Patient/family education;Manual techniques;Dry needling;Taping;Passive range of motion    PT Next Visit Plan FOTO and set goal please:  Neck: DN to left SCM, bil  UT, bil suboccipitals, cspine; isometrics, postural strength.  If approved address shoulder as well (add goals)    PT Home Exercise Plan [id number]    Consulted and Agree with Plan of Care Patient;Family member/caregiver   daughter          Patient will benefit from skilled therapeutic intervention in order to improve the following deficits and impairments:  Decreased range of motion, Pain, Impaired UE functional use, Increased muscle spasms, Impaired flexibility, Decreased strength, Postural dysfunction  Visit Diagnosis: Cervicalgia  Acute pain of right shoulder  Stiffness of right shoulder, not elsewhere classified  Other muscle spasm     Problem List Patient Active Problem List   Diagnosis Date Noted  . New daily persistent headache 11/06/2019  . Dysphagia 11/06/2019  . Cervical radiculopathy 11/06/2019  . Cervico-occipital neuralgia 11/06/2019  . Cervicalgia 11/06/2019  . Chronic neck pain 11/06/2019    Eulis Foster, PT 12/25/19 3:51 PM   [facility] [address] [place], [place], [address] Phone: [phone]   Fax:  [phone]  Name: Hailey Moody MRN: [id number] Date of Birth: Nov 18, 1945

## 2019-12-25 NOTE — Addendum Note (Signed)
Addended by: Bea Laura B on: 12/25/2019 11:03 AM   Modules accepted: Orders

## 2019-12-25 NOTE — Telephone Encounter (Signed)
Spoke with patient, patient states that she has felt better on the 5mg  amlodipine and she states that her BP is better controlled with that as well. Advised patient of Dr. recommendation of continuing the Amlodipine 5mg  daily. Advised patient I would call this in to the pharmacy. Patient verbalized understanding.

## 2019-12-26 ENCOUNTER — Telehealth: Payer: Self-pay | Admitting: Hematology and Oncology

## 2019-12-26 NOTE — Telephone Encounter (Signed)
Received a new pt referral from Dr. Wynelle Link for lytic bone lesions. Hailey Moody has been scheduled to see Dr. Leonides Schanz on 10/8 at 9am. I cld and left the appt date and time on the pt's vm.

## 2019-12-27 ENCOUNTER — Telehealth (HOSPITAL_COMMUNITY): Payer: Self-pay | Admitting: Internal Medicine

## 2019-12-27 ENCOUNTER — Telehealth: Payer: Self-pay | Admitting: Neurology

## 2019-12-27 NOTE — Telephone Encounter (Signed)
Will route to MD and nurse at this time to make aware.  Thanks!

## 2019-12-27 NOTE — Telephone Encounter (Signed)
I spoke to family and CT of the soft tissue of the neck showed possible malignancy at this time they will hold off on any other referrals.

## 2019-12-27 NOTE — Telephone Encounter (Signed)
Patients daughter called and cancelled due to she was just diagnosed with cancer and does not wish to reschedule at this time. Order will be removed from the [facility] WQ.

## 2019-12-30 ENCOUNTER — Encounter: Payer: Self-pay | Admitting: Hematology and Oncology

## 2019-12-30 ENCOUNTER — Other Ambulatory Visit (HOSPITAL_COMMUNITY): Payer: Medicare PPO

## 2020-01-01 ENCOUNTER — Ambulatory Visit: Payer: Medicare PPO | Admitting: Physical Therapy

## 2020-01-03 ENCOUNTER — Inpatient Hospital Stay: Payer: Medicare PPO | Attending: Hematology and Oncology | Admitting: Hematology and Oncology

## 2020-01-03 ENCOUNTER — Inpatient Hospital Stay: Payer: Medicare PPO

## 2020-01-03 ENCOUNTER — Other Ambulatory Visit: Payer: Self-pay

## 2020-01-03 VITALS — BP 183/91 | HR 96 | Temp 97.8°F | Resp 96 | Ht 62.0 in | Wt 81.9 lb

## 2020-01-03 DIAGNOSIS — R531 Weakness: Secondary | ICD-10-CM | POA: Diagnosis not present

## 2020-01-03 DIAGNOSIS — M542 Cervicalgia: Secondary | ICD-10-CM | POA: Insufficient documentation

## 2020-01-03 DIAGNOSIS — M5481 Occipital neuralgia: Secondary | ICD-10-CM

## 2020-01-03 DIAGNOSIS — M899 Disorder of bone, unspecified: Secondary | ICD-10-CM | POA: Diagnosis not present

## 2020-01-03 DIAGNOSIS — R937 Abnormal findings on diagnostic imaging of other parts of musculoskeletal system: Secondary | ICD-10-CM | POA: Insufficient documentation

## 2020-01-03 DIAGNOSIS — M5412 Radiculopathy, cervical region: Secondary | ICD-10-CM

## 2020-01-03 DIAGNOSIS — Z23 Encounter for immunization: Secondary | ICD-10-CM | POA: Diagnosis not present

## 2020-01-03 DIAGNOSIS — F17210 Nicotine dependence, cigarettes, uncomplicated: Secondary | ICD-10-CM | POA: Insufficient documentation

## 2020-01-03 LAB — CMP (CANCER CENTER ONLY)
ALT: 15 U/L (ref 0–44)
AST: 22 U/L (ref 15–41)
Albumin: 2.7 g/dL — ABNORMAL LOW (ref 3.5–5.0)
Alkaline Phosphatase: 105 U/L (ref 38–126)
Anion gap: 8 (ref 5–15)
BUN: 20 mg/dL (ref 8–23)
CO2: 32 mmol/L (ref 22–32)
Calcium: 11.7 mg/dL — ABNORMAL HIGH (ref 8.9–10.3)
Chloride: 86 mmol/L — ABNORMAL LOW (ref 98–111)
Creatinine: 0.82 mg/dL (ref 0.44–1.00)
GFR, Estimated: >60 mL/min (ref >60)
Glucose, Bld: 92 mg/dL (ref 70–99)
Potassium: 4.5 mmol/L (ref 3.5–5.1)
Sodium: 126 mmol/L — ABNORMAL LOW (ref 135–145)
Total Bilirubin: 0.5 mg/dL (ref 0.3–1.2)
Total Protein: 7.6 g/dL (ref 6.5–8.1)

## 2020-01-03 LAB — CBC WITH DIFFERENTIAL (CANCER CENTER ONLY)
Abs Immature Granulocytes: 0.06 10*3/uL (ref 0.00–0.07)
Basophils Absolute: 0 10*3/uL (ref 0.0–0.1)
Basophils Relative: 0 %
Eosinophils Absolute: 0 10*3/uL (ref 0.0–0.5)
Eosinophils Relative: 0 %
HCT: 33.6 % — ABNORMAL LOW (ref 36.0–46.0)
Hemoglobin: 11.3 g/dL — ABNORMAL LOW (ref 12.0–15.0)
Immature Granulocytes: 1 %
Lymphocytes Relative: 6 %
Lymphs Abs: 0.7 10*3/uL (ref 0.7–4.0)
MCH: 27.9 pg (ref 26.0–34.0)
MCHC: 33.6 g/dL (ref 30.0–36.0)
MCV: 83 fL (ref 80.0–100.0)
Monocytes Absolute: 1 10*3/uL (ref 0.1–1.0)
Monocytes Relative: 9 %
Neutro Abs: 10.1 10*3/uL — ABNORMAL HIGH (ref 1.7–7.7)
Neutrophils Relative %: 84 %
Platelet Count: 512 10*3/uL — ABNORMAL HIGH (ref 150–400)
RBC: 4.05 MIL/uL (ref 3.87–5.11)
RDW: 13.5 % (ref 11.5–15.5)
WBC Count: 11.9 10*3/uL — ABNORMAL HIGH (ref 4.0–10.5)
nRBC: 0 % (ref 0.0–0.2)

## 2020-01-03 LAB — TSH: TSH: 20.906 u[IU]/mL — ABNORMAL HIGH (ref 0.308–3.960)

## 2020-01-03 LAB — LACTATE DEHYDROGENASE: LDH: 284 U/L — ABNORMAL HIGH (ref 98–192)

## 2020-01-03 MED ORDER — INFLUENZA VAC A&B SA ADJ QUAD 0.5 ML IM PRSY
PREFILLED_SYRINGE | INTRAMUSCULAR | Status: AC
  Filled 2020-01-03: qty 0.5

## 2020-01-03 MED ORDER — TRAMADOL HCL 50 MG PO TABS: 50.0000 mg | ORAL_TABLET | Freq: Four times a day (QID) | ORAL | 0 refills | Status: DC | PRN

## 2020-01-03 MED ORDER — INFLUENZA VAC A&B SA ADJ QUAD 0.5 ML IM PRSY
0.5000 mL | PREFILLED_SYRINGE | Freq: Once | INTRAMUSCULAR | Status: AC
  Administered 2020-01-03: 0.5 mL via INTRAMUSCULAR

## 2020-01-03 NOTE — Patient Instructions (Signed)
Pt given flu vaccine information

## 2020-01-03 NOTE — Progress Notes (Signed)
[facility] [phone]   Fax:([phone] [place] NOTE  Patient Care Team: Donald Prose, MD as PCP - General (Family Medicine)  Hematological/Oncological History # Lytic Bone Lesions 1) 12/25/2019: CT soft tissue neck performed revealed multifocal lytic osseous lesions: left skull base, C2 vertebra, and right scapula. In conjunction with left level IV lymphadenopathy 2) 01/03/2020: establish care with Dr. Lorenso Courier   CHIEF COMPLAINTS/PURPOSE OF CONSULTATION:  "Lytic Bone Lesion "  HISTORY OF PRESENTING ILLNESS:  Hailey Moody 74 y.o. female with medical history significant for COPD, HTN, HLD, hypothyroidism, and arthritis who presents for evaluation of a lytic lesion.   On review of the previous records Hailey Moody recently had a CT scan of the neck performed on 12/25/2019.  At that time she was revealed to have multifocal lytic osseous lesions on the left skull base, C2 vertebra, and right scapula.  This was in conjunction with a left level four lymphadenopathy.  Due to concern for these lytic lesions the patient was referred to hematology for further evaluation and management.  On exam today Hailey Moody is accompanied by her daughter.  She reports that her symptoms began in June 2021 with terrible headaches.  She knows there were awful and nothing would help stop them.  She reports they are predominantly on the left side of her head.  She reports that she underwent evaluation with rheumatology who thought this may represent temporal arteritis and she had a biopsy of her artery.  She is also put on steroid therapy at that time.  She also endorses having issues with swelling and has been the biggest issue for her.  She is lost a considerable amount of weight and is currently only 80 pounds.  She has been working with physical therapy which has been helping somewhat with the neck pain.  She was evaluated by ENT who did not believe that there is any intervention that can  be done for swallowing and the speech therapist who recommend consideration of a GI evaluation.  She notes she does have some issues with constipation but has otherwise been quite well.  She denies having issues with fevers, chills, sweats, nausea, vomiting or diarrhea.  A full ten point ROS is listed below.  MEDICAL HISTORY:  Past Medical History:  Diagnosis Date   Arthritis    Borderline diabetes    COPD (chronic obstructive pulmonary disease) (HCC)    on spiriva, nebulizer   Essential hypertension    Fatigue    unspecified   Heart attack ([place])    "mild"   Hyperlipidemia    Hypothyroidism    Mini stroke (HCC)    Vitamin D deficiency     SURGICAL HISTORY: Past Surgical History:  Procedure Laterality Date   temporal artery biopsy Left 10/02/2019    SOCIAL HISTORY: Social History   Socioeconomic History   Marital status: Single    Spouse name: Not on file   Number of children: Not on file   Years of education: Not on file   Highest education level: Not on file  Occupational History   Not on file  Tobacco Use   Smoking status: Current Every Day Smoker    Packs/day: 0.50    Years: 60.00    Pack years: 30.00    Types: Cigarettes    Start date: 25   Smokeless tobacco: Never Used  Substance and Sexual Activity   Alcohol use: Not Currently   Drug use: Never   Sexual activity: Not  on file  Other Topics Concern   Not on file  Social History Narrative   Lives with her grandson in [place] but currently here living with her daughter due to medical reasons   Right handed   Caffeine: about 2 cups/day lately, normally 5.   Social Determinants of Health   Financial Resource Strain:    Difficulty of Paying Living Expenses: Not on file  Food Insecurity:    Worried About Charity fundraiser in the Last Year: Not on file   [employer] of Food in the Last Year: Not on file  Transportation Needs:    Lack of Transportation (Medical): Not on file    Lack of Transportation (Non-Medical): Not on file  Physical Activity:    Days of Exercise per Week: Not on file   Minutes of Exercise per Session: Not on file  Stress:    Feeling of Stress : Not on file  Social Connections:    Frequency of Communication with Friends and Family: Not on file   Frequency of Social Gatherings with Friends and Family: Not on file   Attends Religious Services: Not on file   Active Member of Clubs or Organizations: Not on file   Attends Archivist Meetings: Not on file   Marital Status: Not on file  Intimate Partner Violence:    Fear of Current or Ex-Partner: Not on file   Emotionally Abused: Not on file   Physically Abused: Not on file   Sexually Abused: Not on file    FAMILY HISTORY: Family History  Problem Relation Age of Onset   Diabetes type I Child    Stroke Brother    Heart attack Mother    Heart attack Father     ALLERGIES:  is allergic to penicillins.  MEDICATIONS:  Current Outpatient Medications  Medication Sig Dispense Refill   albuterol (ACCUNEB) 1.25 MG/3ML nebulizer solution Take 3 mLs by nebulization. Three times per day as needed.     Albuterol Sulfate (PROAIR HFA IN) Inhale 2 puffs into the lungs. Every 4-6 hours as needed.     amLODipine (NORVASC) 5 MG tablet Take 1 tablet (5 mg total) by mouth daily. 30 tablet 3   busPIRone HCl (BUSPAR PO) Take by mouth.     Calcium Carbonate-Vitamin D3 (CALCIUM 600/VITAMIN D) 600-400 MG-UNIT TABS Take 1 tablet by mouth in the morning and at bedtime.     ergocalciferol (VITAMIN D2) 1.25 MG (50000 UT) capsule Take 50,000 Units by mouth once a week. On Tuesdays     gabapentin (NEURONTIN) 100 MG capsule Take 1 capsule (100 mg total) by mouth 3 (three) times daily. 270 capsule 3   HYDROcodone-acetaminophen (NORCO/VICODIN) 5-325 MG tablet Take 1 tablet by mouth every 8 (eight) hours.      levothyroxine (SYNTHROID) 125 MCG tablet Take 125 mcg by mouth daily.      losartan (COZAAR) 100 MG tablet Take 100 mg by mouth daily.     metoprolol tartrate (LOPRESSOR) 25 MG tablet Take 25 mg by mouth daily.     omeprazole (PRILOSEC) 20 MG capsule Take 20 mg by mouth daily.     sertraline (ZOLOFT) 100 MG tablet Take 100 mg by mouth daily.      SPIRIVA HANDIHALER 18 MCG inhalation capsule Place 1 capsule into inhaler and inhale daily.     Current Facility-Administered Medications  Medication Dose Route Frequency Provider Last Rate Last Admin   bupivacaine (MARCAINE) 0.5 % (with pres) injection 3 mL  3  mL Other Once Magnus Sinning, MD       methylPREDNISolone acetate (DEPO-MEDROL) injection 80 mg  80 mg Other Once Magnus Sinning, MD        REVIEW OF SYSTEMS:   Constitutional: ( - ) fevers, ( - )  chills , ( - ) night sweats Eyes: ( - ) blurriness of vision, ( - ) double vision, ( - ) watery eyes Ears, nose, mouth, throat, and face: ( - ) mucositis, ( - ) sore throat Respiratory: ( - ) cough, ( - ) dyspnea, ( - ) wheezes Cardiovascular: ( - ) palpitation, ( - ) chest discomfort, ( - ) lower extremity swelling Gastrointestinal:  ( - ) nausea, ( - ) heartburn, ( - ) change in bowel habits Skin: ( - ) abnormal skin rashes Lymphatics: ( - ) new lymphadenopathy, ( - ) easy bruising Neurological: ( - ) numbness, ( - ) tingling, ( - ) new weaknesses Behavioral/Psych: ( - ) mood change, ( - ) new changes  All other systems were reviewed with the patient and are negative.  PHYSICAL EXAMINATION: ECOG PERFORMANCE STATUS: 3 - Symptomatic, >50% confined to bed  There were no vitals filed for this visit. There were no vitals filed for this visit.  GENERAL:chronically ill appearing elderly Caucasian female in NAD  SKIN: skin color, texture, turgor are normal, no rashes or significant lesions EYES: conjunctiva are pink and non-injected, sclera clear LUNGS: clear to auscultation and percussion with normal breathing effort HEART: regular rate & rhythm and no  murmurs and no lower extremity edema Musculoskeletal: no cyanosis of digits and no clubbing  PSYCH: alert & oriented x 3, fluent speech NEURO: no focal motor/sensory deficits  LABORATORY DATA:  I have reviewed the data as listed CBC Latest Ref Rng & Units 11/06/2019  WBC 3.4 - 10.8 x10E3/uL 9.5  Hemoglobin 11.1 - 15.9 g/dL 13.2  Hematocrit 34.0 - 46.6 % 39.0  Platelets 150 - 450 x10E3/uL 486(H)    CMP Latest Ref Rng & Units 11/06/2019  Glucose 65 - 99 mg/dL 87  BUN 8 - 27 mg/dL 16  Creatinine 0.57 - 1.00 mg/dL 0.87  Sodium 134 - 144 mmol/L 129(L)  Potassium 3.5 - 5.2 mmol/L 4.3  Chloride 96 - 106 mmol/L 88(L)  CO2 20 - 29 mmol/L 26  Calcium 8.7 - 10.3 mg/dL 10.1  Total Protein 6.0 - 8.5 g/dL 7.1  Total Bilirubin 0.0 - 1.2 mg/dL 0.4  Alkaline Phos 48 - 121 IU/L 124(H)  AST 0 - 40 IU/L 18  ALT 0 - 32 IU/L 11    RADIOGRAPHIC STUDIES: CT Soft Tissue Neck W Contrast  Addendum Date: 12/25/2019   ADDENDUM REPORT: 12/25/2019 08:01 ADDENDUM: Study discussed by telephone with Dr. Melony Overly on 12/24/2019 at 1430 hours. 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 Electronically Signed   By: Genevie Ann M.D.   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 EXAM: CT NECK WITH CONTRAST TECHNIQUE: Multidetector CT imaging of the neck was performed using the standard protocol following the bolus administration of intravenous contrast. CONTRAST:  48m ISOVUE-300 IOPAMIDOL (ISOVUE-300) INJECTION 61% COMPARISON:  Cervical spine and brain MRI 11/20/2019. FINDINGS: Pharynx and larynx: Mild motion artifact at the larynx, but evidence of mild laryngeal asymmetry on the  right including asymmetric enlargement of the right piriform sinus. No discrete laryngeal mass or  hyperenhancement. Pharyngeal soft tissue contours are within normal limits. Negative parapharyngeal and retropharyngeal spaces. Unremarkable visible cervical esophagus. Salivary glands: Sublingual space, submandibular glands and parotid glands are within normal limits. Thyroid: Diminutive or absent, no definite thyroid parenchyma identified. Lymph nodes: Subcentimeter but heterogeneous and conspicuous left level 4 lymph nodes are best demonstrated on series 10, image 57. Individually these are 8 mm short axis (about 16 mm long axis, with central hypodensity. However, no other cervical lymphadenopathy identified. Vascular: Bulky bilateral cervical carotid calcified atherosclerosis, including high-grade stenoses at the bilateral distal ICA bulbs, radiographic string sign on the left (series 2, image 47) and approaching a string sign on the right (image 44). None the less, the major vascular structures in the neck and at the skull base are patent. Limited intracranial: Small 12 mm enhancing mass projecting into the lower left posterior fossa abutting the cerebellum (series 2, image 18), corresponding to a finding described on the brain MRI last month. However, see skeletal findings below. Visualized orbits: Negative. Mastoids and visualized paranasal sinuses: Visible paranasal sinuses are clear. Tympanic cavities remain clear. However, there is a left mastoid effusion associated with destruction of the left skull base, see below. Skeleton: Lytic lesions in the left lower clivus (series 4, image 15), left occipital condyle, inferior left mastoid bone C2 posterior elements (series 4, image 38), lateral right scapula (series 4, image 91). Extraosseous extension of tumor from the C2 spinous process (series 2, image 39). Pathologic fracture of the lateral right scapula. Indeterminate moderate compression fracture of T6. Mild T3 superior endplate compression. Upper chest: Emphysema. Small 7 mm lung nodule in the anterior  right lower lobe on series 8, image 48. Most of the lungs are not included. Calcified aortic atherosclerosis. Subcentimeter but indeterminate left AP window lymph node measuring 8 mm on series 2, image 116. Other: Cachexia. IMPRESSION: 1. Positive for multifocal lytic osseous lesions: left skull base, C2 vertebra, and right scapula. In conjunction with left level IV lymphadenopathy (see #2), favor metastatic disease undetermined primary (see #3). Pathologic fracture right scapula. Indeterminate compression fractures of T3 and T6. 2. Small but malignant appearing lymph nodes at the left level IV station. These along with the left skull base destruction could predispose to left vocal cord paralysis, although the asymmetry of the motion degraded larynx appears more compatible with a right side cord paralysis. No right side causative lesion is identified. 3. Emphysema (ICD10-J43.9) with a suspicious 7 mm right lower lobe pulmonary nodule in the visible lungs. Small but suspicious left AP window lymph node. 4. Severe calcified atherosclerosis of both ICAs, with Severe bilateral ICA bulb stenoses. Aortic Atherosclerosis (ICD10-I70.0). Electronically Signed: By: Genevie Ann M.D. On: 12/24/2019 10:38   XR C-ARM NO REPORT  Result Date: 12/12/2019 Please see Notes tab for imaging impression.   ASSESSMENT & PLAN Genesia Caslin 74 y.o. female with medical history significant for COPD, HTN, HLD, hypothyroidism, and arthritis who presents for evaluation of a lytic lesion.  After review the labs, the records, discussion with the patient the findings are most consistent with lytic bone lesions of unclear etiology.  The leading differential would be multiple myeloma versus other metastatic neoplastic process.  I would recommend that we proceed with an SPEP and serum free light chains in order to start the evaluation for multiple myeloma.  Additionally I would recommend a PET CT scan in order to better quantify the lytic  lesions in  the skeleton as well as help determine if there is any additional malignancy which may be causing these findings.    In the interim I would recommend tramadol for pain control the patient continue to follow with physical therapy.  If there are concerning findings for hematological malignancy we will proceed with a bone marrow biopsy in order to help confirm the diagnosis.  That being said the patient is badly deconditioned and I am not sure she would be a good candidate for treatments moving forward.  I would like to confirm the diagnosis prior to having goals of care discussions with the family, and additionally there may be some treatments we would offer her weakened state.  #Lytic Bone Lesions --findings are concerning for lytic bone lesions 2/2 to malignancy vs multiple myeloma. Will order a PET CT scan in order to evaluate for solid tumor vs active lytic lesions --will perform MM workup to include SPEP and SFLC --consider Bmbx pending the results of the above studies.  --RTC once above workup is complete  #Symptom management --continue working with physical therapy --tramadol 40m PO q6H PRN for pain control --continue to monitor   No orders of the defined types were placed in this encounter.   All questions were answered. The patient knows to call the clinic with any problems, questions or concerns.  A total of more than 60 minutes were spent on this encounter and over half of that time was spent on counseling and coordination of care as outlined above.   JLedell Peoples MD Department of Hematology/Oncology [facility]Phone: [phone]: [phone]: [email or web address]  01/03/2020 8:23 AM

## 2020-01-04 LAB — BETA 2 MICROGLOBULIN, SERUM: Beta-2 Microglobulin: 4.7 mg/L — ABNORMAL HIGH (ref 0.6–2.4)

## 2020-01-06 DIAGNOSIS — M899 Disorder of bone, unspecified: Secondary | ICD-10-CM | POA: Diagnosis not present

## 2020-01-06 LAB — KAPPA/LAMBDA LIGHT CHAINS
Kappa free light chain: 45.4 mg/L — ABNORMAL HIGH (ref 3.3–19.4)
Kappa, lambda light chain ratio: 1.75 — ABNORMAL HIGH (ref 0.26–1.65)
Lambda free light chains: 25.9 mg/L (ref 5.7–26.3)

## 2020-01-07 ENCOUNTER — Ambulatory Visit: Payer: Medicare PPO | Admitting: Physical Medicine and Rehabilitation

## 2020-01-07 ENCOUNTER — Encounter: Payer: Self-pay | Admitting: Physical Medicine and Rehabilitation

## 2020-01-07 ENCOUNTER — Telehealth: Payer: Self-pay | Admitting: Physical Medicine and Rehabilitation

## 2020-01-07 ENCOUNTER — Ambulatory Visit: Payer: Medicare PPO | Admitting: Diagnostic Neuroimaging

## 2020-01-07 LAB — MULTIPLE MYELOMA PANEL, SERUM
Albumin SerPl Elph-Mcnc: 2.9 g/dL (ref 2.9–4.4)
Albumin/Glob SerPl: 0.8 (ref 0.7–1.7)
Alpha 1: 0.6 g/dL — ABNORMAL HIGH (ref 0.0–0.4)
Alpha2 Glob SerPl Elph-Mcnc: 1.2 g/dL — ABNORMAL HIGH (ref 0.4–1.0)
B-Globulin SerPl Elph-Mcnc: 0.8 g/dL (ref 0.7–1.3)
Gamma Glob SerPl Elph-Mcnc: 1.3 g/dL (ref 0.4–1.8)
Globulin, Total: 4 g/dL — ABNORMAL HIGH (ref 2.2–3.9)
IgA: 173 mg/dL (ref 64–422)
IgG (Immunoglobin G), Serum: 916 mg/dL (ref 586–1602)
IgM (Immunoglobulin M), Srm: 695 mg/dL — ABNORMAL HIGH (ref 26–217)
M Protein SerPl Elph-Mcnc: 0.5 g/dL — ABNORMAL HIGH
Total Protein ELP: 6.9 g/dL (ref 6.0–8.5)

## 2020-01-07 NOTE — Telephone Encounter (Signed)
Called pt back and resch for 11/30.

## 2020-01-07 NOTE — Telephone Encounter (Signed)
Patient's daughter called. She would like to [facility] her appointment. Her call back number is [phone]

## 2020-01-08 ENCOUNTER — Encounter: Payer: Self-pay | Admitting: Physical Therapy

## 2020-01-08 ENCOUNTER — Ambulatory Visit: Payer: Medicare PPO | Attending: Neurology | Admitting: Physical Therapy

## 2020-01-08 ENCOUNTER — Other Ambulatory Visit: Payer: Self-pay

## 2020-01-08 DIAGNOSIS — M62838 Other muscle spasm: Secondary | ICD-10-CM

## 2020-01-08 DIAGNOSIS — M542 Cervicalgia: Secondary | ICD-10-CM | POA: Diagnosis present

## 2020-01-08 DIAGNOSIS — M25611 Stiffness of right shoulder, not elsewhere classified: Secondary | ICD-10-CM | POA: Diagnosis present

## 2020-01-08 DIAGNOSIS — M25511 Pain in right shoulder: Secondary | ICD-10-CM | POA: Diagnosis present

## 2020-01-08 LAB — UPEP/UIFE/LIGHT CHAINS/TP, 24-HR UR
% BETA, Urine: 30.7 %
ALPHA 1 URINE: 6.5 %
Albumin, U: 23.3 %
Alpha 2, Urine: 15.3 %
Free Kappa Lt Chains,Ur: 121.72 mg/L — ABNORMAL HIGH (ref 0.63–113.79)
Free Kappa/Lambda Ratio: 8.18 (ref 1.03–31.76)
Free Lambda Lt Chains,Ur: 14.88 mg/L — ABNORMAL HIGH (ref 0.47–11.77)
GAMMA GLOBULIN URINE: 24.2 %
Total Protein, Urine-Ur/day: 75 mg/24 hr (ref 30–150)
Total Protein, Urine: 14.9 mg/dL
Total Volume: 500

## 2020-01-08 NOTE — Patient Instructions (Signed)
RE-ALIGNMENT ROUTINE EXERCISES-OSTEOPROROSIS BASIC FOR POSTURAL CORRECTION   RE-ALIGNMENT Tips BENEFITS: 1.It helps to re-align the curves of the back and improve standing posture. 2.It allows the back muscles to rest and strengthen in preparation for more activity. FREQUENCY: Daily, even after weeks, months and years of more advanced exercises. START: 1.All exercises start in the same position: lying on the back, arms resting on the supporting surface, palms up and slightly away from the body, backs of hands down, knees bent, feet flat. 2.The head, neck, arms, and legs are supported according to specific instructions of your therapist. Copyright  VHI. All rights reserved.    1. Decompression Exercise: Basic.   Takes compression off the vertebral bodies; increases tolerance for lying on the back; helps relieve back pain   Lie on back on firm surface, knees bent, feet flat, arms turned up, out to sides (~35 degrees). Head neck and arms supported as necessary. Time _5-15__ minutes. Surface: floor     2. Shoulder Press  Strengthens upper back extensors and scapular retractors.   Press both shoulders down. Hold _2-3__ seconds. Repeat _3-5__ times. Surface: floor        3. Head Press With Chin Tuck  Strengthens neck extensors   Tuck chin SLIGHTLY toward chest, keep mouth closed. Feel weight on back of head. Increase weight by pressing head down. Hold _2-3__ seconds. Relax. Repeat 3-5___ times. Surface: floor     4. Leg Lengthener: stretches quadratus lumborum and hip flexors.  Strengthens quads and ankle dorsiflexors.  Leg Lengthener: Full    Straighten one leg. Pull toes AND forefoot toward knee, extend heel. Lengthen leg by pulling pelvis away from ribs. Hold __5_ seconds. Relax. Repeat 1 time. Re-bend knee. Do other leg. Each leg __5_ times. Surface: floor    Leg Lengthener / Leg Press Combo: Single Leg    Straighten one leg down to floor. Pull toes AND forefoot  toward knee; extend heel. Lengthen leg by pulling pelvis away from ribs. Press leg down. DO NOT BEND KNEE. Hold _5__ seconds. Relax leg. Repeat exercise 1 time. Relax leg. Re-bend knee. Repeat with other leg. Do 5 times   Solon Palm, PT 01/08/20 2:40 PM; [facility] [address] [place], [place] [address] Phone # [phone] Fax [phone]

## 2020-01-08 NOTE — Therapy (Signed)
[facility] [address] [place], [place], [address] Phone: [phone]   Fax:  [phone]  Physical Therapy Treatment  Patient Details  Name: Hailey Moody MRN: [id number] Date of Birth: [birth year] Referring Provider (PT): Sarina Ill MD   Encounter Date: 01/08/2020   PT End of Session - 01/08/20 1358    Visit Number 3    Number of Visits 12    Date for PT Re-Evaluation 01/29/20    Authorization Type [employer]    Authorization Time Period 12/18/19-01/29/20    Authorization - Visit Number 3    Authorization - Number of Visits 12    Progress Note Due on Visit 10    PT Start Time 1400    PT Stop Time 1441    PT Time Calculation (min) 41 min    Activity Tolerance Patient tolerated treatment well;No increased pain    Behavior During Therapy WFL for tasks assessed/performed           Past Medical History:  Diagnosis Date  . Arthritis   . Borderline diabetes   . COPD (chronic obstructive pulmonary disease) (HCC)    on spiriva, nebulizer  . Essential hypertension   . Fatigue    unspecified  . Heart attack ([place])    "mild"  . Hyperlipidemia   . Hypothyroidism   . Mini stroke ([place])   . Vitamin D deficiency     Past Surgical History:  Procedure Laterality Date  . temporal artery biopsy Left 10/02/2019    There were no vitals filed for this visit.   Subjective Assessment - 01/08/20 1358    Subjective Hurting in cervical and upper thoracic spine and into low back. 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 Oncologist gave okay to continue PT.    Patient is accompained by: Family member    Pertinent History NEW CA diagnosis, OA, COPD, HTN, acute fatigue, HA, swallowing difficulties    Diagnostic tests CT - increased lordosis c spine; xrays shoulder - degeneration    Patient Stated Goals to get rid of tension and pain in neck and be  able to use my shoulder    Currently in Pain? Yes    Pain Score 7     Pain Location Neck    Pain Orientation Right    Pain Descriptors / Indicators Aching    Pain Type Acute pain              [facility] PT Assessment - 01/08/20 0001      Precautions   Precautions Other (comment)    Precaution Comments NEW cancer dx (spine)                         [facility] Adult PT Treatment/Exercise - 01/08/20 0001      Exercises   Exercises Neck      Neck Exercises: Supine   Other Supine Exercise decompression series hooklying breathing for relaxation; head press, shoulder press, leg lengthener and leg press 3 sec hold x 5 ea      Modalities   Modalities Moist Heat      Moist Heat Therapy   Number Minutes Moist Heat --   for pt warmth (she just held it in her arms in [facility])      Manual Therapy   Manual Therapy Soft tissue mobilization    Manual therapy  comments skilled palpation and monitoring of soft tissues during DN    Soft tissue mobilization to right UT, cerv paraspinals, parascapular muscles into post [facility]            Trigger Point Dry Needling - 01/08/20 0001    Consent Given? Yes    Education Handout Provided Previously provided    Muscles Treated Head and Neck Upper trapezius    Dry Needling Comments right side in left SDLY    Upper Trapezius Response Twitch reponse elicited;Palpable increased muscle length                PT Education - 01/08/20 1440    Education Details HEP - decompression exercises    Person(s) Educated Patient    Methods Explanation;Demonstration;Handout    Comprehension Verbalized understanding;Returned demonstration            PT Short Term Goals - 01/08/20 1945      PT SHORT TERM GOAL #1   Title Ind with initial HEP    Status Partially Met             PT Long Term Goals - 12/18/19 1619      PT LONG TERM GOAL #1   Title decreased pain in neck by 50% with motion    Time 6    Period Weeks    Status New    Target  Date 01/29/20      PT LONG TERM GOAL #2   Title Visible and palpable decrease in tension of left SCM and UT to help reduce neck pain.    Time 6    Period Weeks    Status New      PT LONG TERM GOAL #3   Title Improved neck strength to 4/5 or better in all planes.    Time 6    Period Weeks    Status New      PT LONG TERM GOAL #4   Title Patient able to sleep without waking from neck pain    Time 6    Period Weeks    Status New                 Plan - 01/08/20 1450    Clinical Impression Statement Patient presents with increased pain in her neck, upper back and right shoulder. Her daughter reports that she missed last week due to seeing oncologist and that she has been diagnosed with CA in her neck and back with PET scan scheduled for next week. Patient responded well to initial trial of DN and manual therapy and requested more today with very good results. She also was issued decompression exercises as she reports she is in bed most of the time due to fatigue. She did very well with them. Shoulder goals deferred until next visit due to change in medical status.    Personal Factors and Comorbidities Age;Comorbidity 3+    Comorbidities OA, COPD, HTN, acute fatigue, HA, swallowing difficulties, MI    Examination-Activity Limitations Carry;Dressing;Lift;Reach Overhead;Sleep    Examination-Participation Restrictions Cleaning;Meal Prep;Other    PT Frequency 2x / week    PT Duration 6 weeks    PT Treatment/Interventions ADLs/Self Care Home Management;Cryotherapy;Electrical Stimulation;Iontophoresis 37m/ml Dexamethasone;Moist Heat;Neuromuscular re-education;Therapeutic exercise;Therapeutic activities;Patient/family education;Manual techniques;Dry needling;Taping;Passive range of motion    PT Next Visit Plan Neck: DN to left SCM, bil UT, bil suboccipitals, cspine; isometrics, postural strength.  Address shoulder as tolerated due to change in medical status (add goals)    PT Home Exercise  Plan [id number]    Consulted and Agree with Plan of Care Patient;Family member/caregiver           Patient will benefit from skilled therapeutic intervention in order to improve the following deficits and impairments:  Decreased range of motion, Pain, Impaired UE functional use, Increased muscle spasms, Impaired flexibility, Decreased strength, Postural dysfunction  Visit Diagnosis: Cervicalgia  Acute pain of right shoulder  Stiffness of right shoulder, not elsewhere classified  Other muscle spasm     Problem List Patient Active Problem List   Diagnosis Date Noted  . New daily persistent headache 11/06/2019  . Dysphagia 11/06/2019  . Cervical radiculopathy 11/06/2019  . Cervico-occipital neuralgia 11/06/2019  . Cervicalgia 11/06/2019  . Chronic neck pain 11/06/2019   Madelyn Flavors PT 01/08/2020, 7:49 PM  [facility] [address], [place], [place], [address] Phone: [phone]   Fax:  [phone]  Name: Dannya Pitkin MRN: [id number] Date of Birth: 12-19-1945

## 2020-01-09 ENCOUNTER — Telehealth: Payer: Self-pay | Admitting: Hematology and Oncology

## 2020-01-09 ENCOUNTER — Encounter: Payer: Self-pay | Admitting: Hematology and Oncology

## 2020-01-09 DIAGNOSIS — M5412 Radiculopathy, cervical region: Secondary | ICD-10-CM

## 2020-01-09 NOTE — Telephone Encounter (Signed)
Called patient's daughter discussed the results of initial lab work.  Findings do show and I GM monoclonal protein at 0.5 with mild elevations in serum free light chains.  This alone is an indication for a bone marrow biopsy which is also strongly supported by the lytic bone lesions.  The patient does have a PET scan scheduled for next week which I think would be reasonable to continue in order to help better evaluate these lytic lesions, but the enlarged lymph node, and other possible solid tumor sources of these findings.  We also plan to have a bone marrow biopsy performed soon as possible.  I did discuss again with the patient's daughter that I am not sure if she is a candidate for chemotherapy in the event we did find cancer, but she was agreeable to proceeding forward with diagnostic procedures in order to help make the diagnosis and determine what treatment options may or may not be available.  In the event the patient were to have any change in her clinical status we strongly recommend that she call so that we can evaluate her promptly.  Ledell Peoples, MD Department of Hematology/Oncology [facility] Phone: [phone] Pager: [phone] Email: [email or web address]

## 2020-01-13 ENCOUNTER — Emergency Department (HOSPITAL_COMMUNITY): Payer: Medicare PPO

## 2020-01-13 ENCOUNTER — Other Ambulatory Visit: Payer: Self-pay

## 2020-01-13 ENCOUNTER — Inpatient Hospital Stay (HOSPITAL_COMMUNITY)
Admission: EM | Admit: 2020-01-13 | Discharge: 2020-01-17 | DRG: 640 | Disposition: A | Discharge status: Skilled Nursing Facility | Payer: Medicare PPO | Attending: Internal Medicine | Admitting: Internal Medicine

## 2020-01-13 ENCOUNTER — Encounter (HOSPITAL_COMMUNITY): Payer: Self-pay | Admitting: Internal Medicine

## 2020-01-13 ENCOUNTER — Encounter: Payer: Self-pay | Admitting: Hematology and Oncology

## 2020-01-13 ENCOUNTER — Telehealth: Payer: Self-pay | Admitting: *Deleted

## 2020-01-13 DIAGNOSIS — J3801 Paralysis of vocal cords and larynx, unilateral: Secondary | ICD-10-CM | POA: Diagnosis present

## 2020-01-13 DIAGNOSIS — F419 Anxiety disorder, unspecified: Secondary | ICD-10-CM | POA: Diagnosis present

## 2020-01-13 DIAGNOSIS — Z79890 Hormone replacement therapy (postmenopausal): Secondary | ICD-10-CM

## 2020-01-13 DIAGNOSIS — F0390 Unspecified dementia without behavioral disturbance: Secondary | ICD-10-CM | POA: Diagnosis present

## 2020-01-13 DIAGNOSIS — J4489 Other specified chronic obstructive pulmonary disease: Secondary | ICD-10-CM

## 2020-01-13 DIAGNOSIS — E876 Hypokalemia: Secondary | ICD-10-CM | POA: Diagnosis present

## 2020-01-13 DIAGNOSIS — E871 Hypo-osmolality and hyponatremia: Secondary | ICD-10-CM

## 2020-01-13 DIAGNOSIS — E860 Dehydration: Secondary | ICD-10-CM | POA: Diagnosis present

## 2020-01-13 DIAGNOSIS — R1310 Dysphagia, unspecified: Secondary | ICD-10-CM

## 2020-01-13 DIAGNOSIS — Z7983 Long term (current) use of bisphosphonates: Secondary | ICD-10-CM

## 2020-01-13 DIAGNOSIS — L8990 Pressure ulcer of unspecified site, unspecified stage: Secondary | ICD-10-CM | POA: Insufficient documentation

## 2020-01-13 DIAGNOSIS — F32A Depression, unspecified: Secondary | ICD-10-CM | POA: Diagnosis present

## 2020-01-13 DIAGNOSIS — J449 Chronic obstructive pulmonary disease, unspecified: Secondary | ICD-10-CM | POA: Diagnosis present

## 2020-01-13 DIAGNOSIS — E43 Unspecified severe protein-calorie malnutrition: Secondary | ICD-10-CM | POA: Diagnosis present

## 2020-01-13 DIAGNOSIS — D630 Anemia in neoplastic disease: Secondary | ICD-10-CM | POA: Diagnosis present

## 2020-01-13 DIAGNOSIS — E785 Hyperlipidemia, unspecified: Secondary | ICD-10-CM | POA: Diagnosis present

## 2020-01-13 DIAGNOSIS — I252 Old myocardial infarction: Secondary | ICD-10-CM

## 2020-01-13 DIAGNOSIS — M898X9 Other specified disorders of bone, unspecified site: Secondary | ICD-10-CM

## 2020-01-13 DIAGNOSIS — E039 Hypothyroidism, unspecified: Secondary | ICD-10-CM | POA: Diagnosis present

## 2020-01-13 DIAGNOSIS — Z823 Family history of stroke: Secondary | ICD-10-CM

## 2020-01-13 DIAGNOSIS — K5900 Constipation, unspecified: Secondary | ICD-10-CM | POA: Diagnosis present

## 2020-01-13 DIAGNOSIS — Z833 Family history of diabetes mellitus: Secondary | ICD-10-CM

## 2020-01-13 DIAGNOSIS — M899 Disorder of bone, unspecified: Secondary | ICD-10-CM | POA: Diagnosis present

## 2020-01-13 DIAGNOSIS — R109 Unspecified abdominal pain: Secondary | ICD-10-CM

## 2020-01-13 DIAGNOSIS — Z8249 Family history of ischemic heart disease and other diseases of the circulatory system: Secondary | ICD-10-CM

## 2020-01-13 DIAGNOSIS — R1312 Dysphagia, oropharyngeal phase: Secondary | ICD-10-CM | POA: Diagnosis present

## 2020-01-13 DIAGNOSIS — Z8673 Personal history of transient ischemic attack (TIA), and cerebral infarction without residual deficits: Secondary | ICD-10-CM

## 2020-01-13 DIAGNOSIS — Z880 Allergy status to penicillin: Secondary | ICD-10-CM

## 2020-01-13 DIAGNOSIS — Z79899 Other long term (current) drug therapy: Secondary | ICD-10-CM

## 2020-01-13 DIAGNOSIS — Z7401 Bed confinement status: Secondary | ICD-10-CM

## 2020-01-13 DIAGNOSIS — I10 Essential (primary) hypertension: Secondary | ICD-10-CM | POA: Diagnosis present

## 2020-01-13 DIAGNOSIS — F17210 Nicotine dependence, cigarettes, uncomplicated: Secondary | ICD-10-CM | POA: Diagnosis present

## 2020-01-13 DIAGNOSIS — R531 Weakness: Secondary | ICD-10-CM

## 2020-01-13 DIAGNOSIS — Z681 Body mass index (BMI) 19 or less, adult: Secondary | ICD-10-CM

## 2020-01-13 DIAGNOSIS — R627 Adult failure to thrive: Secondary | ICD-10-CM | POA: Diagnosis present

## 2020-01-13 DIAGNOSIS — Z20822 Contact with and (suspected) exposure to covid-19: Secondary | ICD-10-CM | POA: Diagnosis present

## 2020-01-13 DIAGNOSIS — Z66 Do not resuscitate: Secondary | ICD-10-CM | POA: Diagnosis not present

## 2020-01-13 DIAGNOSIS — E861 Hypovolemia: Secondary | ICD-10-CM | POA: Diagnosis present

## 2020-01-13 DIAGNOSIS — Z515 Encounter for palliative care: Secondary | ICD-10-CM | POA: Diagnosis not present

## 2020-01-13 DIAGNOSIS — Z7189 Other specified counseling: Secondary | ICD-10-CM | POA: Diagnosis not present

## 2020-01-13 LAB — DIFFERENTIAL
Abs Immature Granulocytes: 0.05 10*3/uL (ref 0.00–0.07)
Basophils Absolute: 0 10*3/uL (ref 0.0–0.1)
Basophils Relative: 0 %
Eosinophils Absolute: 0 10*3/uL (ref 0.0–0.5)
Eosinophils Relative: 0 %
Immature Granulocytes: 0 %
Lymphocytes Relative: 4 %
Lymphs Abs: 0.5 10*3/uL — ABNORMAL LOW (ref 0.7–4.0)
Monocytes Absolute: 0.9 10*3/uL (ref 0.1–1.0)
Monocytes Relative: 8 %
Neutro Abs: 9.8 10*3/uL — ABNORMAL HIGH (ref 1.7–7.7)
Neutrophils Relative %: 88 %

## 2020-01-13 LAB — URINALYSIS, ROUTINE W REFLEX MICROSCOPIC
Bilirubin Urine: NEGATIVE
Glucose, UA: NEGATIVE mg/dL
Hgb urine dipstick: NEGATIVE
Ketones, ur: NEGATIVE mg/dL
Leukocytes,Ua: NEGATIVE
Nitrite: NEGATIVE
Protein, ur: NEGATIVE mg/dL
Specific Gravity, Urine: 1.006 (ref 1.005–1.030)
pH: 7 (ref 5.0–8.0)

## 2020-01-13 LAB — CBC
HCT: 37.5 % (ref 36.0–46.0)
Hemoglobin: 12.3 g/dL (ref 12.0–15.0)
MCH: 27.4 pg (ref 26.0–34.0)
MCHC: 32.8 g/dL (ref 30.0–36.0)
MCV: 83.5 fL (ref 80.0–100.0)
Platelets: 572 10*3/uL — ABNORMAL HIGH (ref 150–400)
RBC: 4.49 MIL/uL (ref 3.87–5.11)
RDW: 13.6 % (ref 11.5–15.5)
WBC: 11.2 10*3/uL — ABNORMAL HIGH (ref 4.0–10.5)
nRBC: 0 % (ref 0.0–0.2)

## 2020-01-13 LAB — COMPREHENSIVE METABOLIC PANEL
ALT: 13 U/L (ref 0–44)
AST: 19 U/L (ref 15–41)
Albumin: 3.2 g/dL — ABNORMAL LOW (ref 3.5–5.0)
Alkaline Phosphatase: 100 U/L (ref 38–126)
Anion gap: 13 (ref 5–15)
BUN: 24 mg/dL — ABNORMAL HIGH (ref 8–23)
CO2: 26 mmol/L (ref 22–32)
Calcium: 12.5 mg/dL — ABNORMAL HIGH (ref 8.9–10.3)
Chloride: 85 mmol/L — ABNORMAL LOW (ref 98–111)
Creatinine, Ser: 0.96 mg/dL (ref 0.44–1.00)
GFR, Estimated: 58 mL/min — ABNORMAL LOW (ref >60)
Glucose, Bld: 95 mg/dL (ref 70–99)
Potassium: 3.8 mmol/L (ref 3.5–5.1)
Sodium: 124 mmol/L — ABNORMAL LOW (ref 135–145)
Total Bilirubin: 0.7 mg/dL (ref 0.3–1.2)
Total Protein: 7.8 g/dL (ref 6.5–8.1)

## 2020-01-13 LAB — LIPASE, BLOOD: Lipase: 20 U/L (ref 11–51)

## 2020-01-13 LAB — RESPIRATORY PANEL BY RT PCR (FLU A&B, COVID)
Influenza A by PCR: NEGATIVE
Influenza B by PCR: NEGATIVE
SARS Coronavirus 2 by RT PCR: NEGATIVE

## 2020-01-13 MED ORDER — SODIUM CHLORIDE 0.9% FLUSH
3.0000 mL | Freq: Two times a day (BID) | INTRAVENOUS | Status: DC
  Administered 2020-01-16 – 2020-01-17 (×2): 3 mL via INTRAVENOUS

## 2020-01-13 MED ORDER — SERTRALINE HCL 100 MG PO TABS
100.0000 mg | ORAL_TABLET | Freq: Every day | ORAL | Status: DC
  Administered 2020-01-13 – 2020-01-17 (×5): 100 mg via ORAL
  Filled 2020-01-13 (×3): qty 1
  Filled 2020-01-13: qty 2
  Filled 2020-01-13: qty 1

## 2020-01-13 MED ORDER — ALBUTEROL SULFATE HFA 108 (90 BASE) MCG/ACT IN AERS
2.0000 | INHALATION_SPRAY | Freq: Four times a day (QID) | RESPIRATORY_TRACT | Status: DC | PRN
  Filled 2020-01-13: qty 6.7

## 2020-01-13 MED ORDER — UMECLIDINIUM BROMIDE 62.5 MCG/INH IN AEPB
1.0000 | INHALATION_SPRAY | Freq: Every day | RESPIRATORY_TRACT | Status: DC
  Administered 2020-01-14 – 2020-01-16 (×3): 1 via RESPIRATORY_TRACT
  Filled 2020-01-13 (×2): qty 7

## 2020-01-13 MED ORDER — SODIUM CHLORIDE 0.9 % IV SOLN: INTRAVENOUS | Status: DC

## 2020-01-13 MED ORDER — AMLODIPINE BESYLATE 5 MG PO TABS
5.0000 mg | ORAL_TABLET | Freq: Every day | ORAL | Status: DC
  Administered 2020-01-13 – 2020-01-17 (×5): 5 mg via ORAL
  Filled 2020-01-13 (×5): qty 1

## 2020-01-13 MED ORDER — ALBUTEROL SULFATE 1.25 MG/3ML IN NEBU: 3.0000 mL | INHALATION_SOLUTION | Freq: Four times a day (QID) | RESPIRATORY_TRACT | Status: DC | PRN

## 2020-01-13 MED ORDER — SODIUM CHLORIDE 0.9 % IV BOLUS
1000.0000 mL | Freq: Once | INTRAVENOUS | Status: AC
  Administered 2020-01-13: 1000 mL via INTRAVENOUS

## 2020-01-13 MED ORDER — ENOXAPARIN SODIUM 30 MG/0.3ML ~~LOC~~ SOLN
30.0000 mg | SUBCUTANEOUS | Status: DC
  Administered 2020-01-14: 30 mg via SUBCUTANEOUS
  Filled 2020-01-13: qty 0.3

## 2020-01-13 MED ORDER — METOPROLOL TARTRATE 5 MG/5ML IV SOLN: 5.0000 mg | Freq: Four times a day (QID) | INTRAVENOUS | Status: DC | PRN

## 2020-01-13 MED ORDER — DOCUSATE SODIUM 100 MG PO CAPS
100.0000 mg | ORAL_CAPSULE | Freq: Two times a day (BID) | ORAL | Status: DC
  Administered 2020-01-14 – 2020-01-17 (×6): 100 mg via ORAL
  Filled 2020-01-13 (×8): qty 1

## 2020-01-13 MED ORDER — ENSURE ENLIVE PO LIQD
237.0000 mL | Freq: Two times a day (BID) | ORAL | Status: DC
  Administered 2020-01-14 – 2020-01-17 (×5): 237 mL via ORAL

## 2020-01-13 MED ORDER — METOPROLOL TARTRATE 25 MG PO TABS
25.0000 mg | ORAL_TABLET | Freq: Every day | ORAL | Status: DC
  Administered 2020-01-13 – 2020-01-17 (×5): 25 mg via ORAL
  Filled 2020-01-13 (×5): qty 1

## 2020-01-13 MED ORDER — HYDROMORPHONE HCL 1 MG/ML IJ SOLN
0.5000 mg | INTRAMUSCULAR | Status: DC | PRN
  Administered 2020-01-14 – 2020-01-15 (×2): 0.5 mg via INTRAVENOUS
  Filled 2020-01-13 (×3): qty 0.5

## 2020-01-13 MED ORDER — LEVOTHYROXINE SODIUM 25 MCG PO TABS
125.0000 ug | ORAL_TABLET | Freq: Every day | ORAL | Status: DC
  Administered 2020-01-14 – 2020-01-17 (×4): 125 ug via ORAL
  Filled 2020-01-13 (×4): qty 1

## 2020-01-13 MED ORDER — TIOTROPIUM BROMIDE MONOHYDRATE 18 MCG IN CAPS: 1.0000 | ORAL_CAPSULE | Freq: Every day | RESPIRATORY_TRACT | Status: DC

## 2020-01-13 MED ORDER — TRAMADOL HCL 50 MG PO TABS
50.0000 mg | ORAL_TABLET | Freq: Four times a day (QID) | ORAL | Status: DC | PRN
  Administered 2020-01-13 – 2020-01-17 (×5): 50 mg via ORAL
  Filled 2020-01-13 (×5): qty 1

## 2020-01-13 MED ORDER — ACETAMINOPHEN 325 MG PO TABS
650.0000 mg | ORAL_TABLET | Freq: Four times a day (QID) | ORAL | Status: DC | PRN
  Administered 2020-01-16 – 2020-01-17 (×2): 650 mg via ORAL
  Filled 2020-01-13 (×2): qty 2

## 2020-01-13 MED ORDER — ACETAMINOPHEN 650 MG RE SUPP: 650.0000 mg | Freq: Four times a day (QID) | RECTAL | Status: DC | PRN

## 2020-01-13 NOTE — ED Triage Notes (Signed)
Patient reports to the ER for weakness and emesis. Patient reports she has been feeling bad x5 months. Patient reports the doctor believes she has multiple myeloma but has not yet had her pet scan (scheduled for tomorrow). Patient is living with her daughter at this time

## 2020-01-13 NOTE — H&P (Signed)
History and Physical        Hospital Admission Note Date: 01/13/2020  Patient name: Hailey Moody Medical record number: [id number] Date of birth: April 13, 1945 Age: 74 y.o. Gender: female  PCP: Donald Prose, MD  Patient coming from: Home   Chief Complaint    Chief Complaint  Patient presents with  . Weakness  . Emesis      HPI:   This is a 74 year old female with a history of COPD, hypertension, hyperlipidemia, hypothyroidism, arthritis who presented to the ED with weakness and emesis which has been worsening over the past several days.  Recently had a CT scan of her neck on 12/25/2019 which revealed a multifocal lytic osseous lesion the left skull base, C2 vertebrae and right scapula and left lymphadenopathy and was referred to hematology/oncology and was seen by Dr. Lorenso Courier on 01/03/2020 who started a multiple myeloma work-up.  Currently has generalized weakness and dry mouth.  Also with fatigue and myalgias and constipation despite laxatives at home.  Reports difficulty with swallowing as well  ED Course: Afebrile, tachycardic and hypertensive on room air.  Notable labs: Sodium 124, chloride 85, BUN 24, creatinine 1.96, corrected calcium 13.1, WBC 11.2.  CXR unremarkable.  She received 1 L NS bolus.  ED physician discussed with oncology who will see tomorrow.  Vitals:   01/13/20 1500 01/13/20 1804  BP: (!) 171/86 (!) 173/81  Pulse: 99 96  Resp: (!) 22   Temp:  97.8 F (36.6 C)  SpO2: 99% 100%     Review of Systems:  Review of Systems  All other systems reviewed and are negative.   Medical/Social/Family History   Past Medical History: Past Medical History:  Diagnosis Date  . Arthritis   . Borderline diabetes   . COPD (chronic obstructive pulmonary disease) (HCC)    on spiriva, nebulizer  . Essential hypertension   . Fatigue    unspecified  . Heart attack ([place])      "mild"  . Hyperlipidemia   . Hypothyroidism   . Mini stroke ([place])   . Vitamin D deficiency     Past Surgical History:  Procedure Laterality Date  . temporal artery biopsy Left 10/02/2019    Medications: Prior to Admission medications   Medication Sig Start Date End Date Taking? Authorizing Provider  albuterol (ACCUNEB) 1.25 MG/3ML nebulizer solution Take 3 mLs by nebulization. Three times per day as needed.    [provider]  Albuterol Sulfate (PROAIR HFA IN) Inhale 2 puffs into the lungs. Every 4-6 hours as needed.    [provider]  Alendronate Sodium (FOSAMAX PO) Take by mouth.    [provider]  amLODipine (NORVASC) 5 MG tablet Take 1 tablet (5 mg total) by mouth daily. 12/25/19   Elouise Munroe, MD  busPIRone HCl (BUSPAR PO) Take by mouth. Patient not taking: Reported on 01/03/2020    [provider]  Calcium Carbonate-Vitamin D3 (CALCIUM 600/VITAMIN D) 600-400 MG-UNIT TABS Take 1 tablet by mouth in the morning and at bedtime. Patient not taking: Reported on 01/03/2020    [provider]  ergocalciferol (VITAMIN D2) 1.25 MG (50000 UT) capsule Take 50,000 Units by mouth once a week. On Tuesdays  [provider]  HYDROcodone-acetaminophen (NORCO/VICODIN) 5-325 MG tablet Take 1 tablet by mouth every 8 (eight) hours.  Patient not taking: Reported on 01/03/2020    [provider]  levothyroxine (SYNTHROID) 125 MCG tablet Take 125 mcg by mouth daily. 10/16/19   [provider]  losartan (COZAAR) 100 MG tablet Take 100 mg by mouth daily. 08/21/19   [provider]  metoprolol tartrate (LOPRESSOR) 25 MG tablet Take 25 mg by mouth daily. 08/21/19   [provider]  omeprazole (PRILOSEC) 20 MG capsule Take 20 mg by mouth daily. Patient not taking: Reported on 01/03/2020 09/17/19   [provider]  sertraline (ZOLOFT) 100 MG tablet Take 100 mg by mouth daily.     [provider]   SPIRIVA HANDIHALER 18 MCG inhalation capsule Place 1 capsule into inhaler and inhale daily. 10/16/19   [provider]  traMADol (ULTRAM) 50 MG tablet Take 1 tablet (50 mg total) by mouth every 6 (six) hours as needed. 01/03/20   Orson Slick, MD    Allergies:   Allergies  Allergen Reactions  . Penicillins     "passed out"  Did it involve swelling of the face/tongue/throat, SOB, or low BP? N Did it involve sudden or severe rash/hives, skin peeling, or any reaction on the inside of your mouth or nose? N Did you need to seek medical attention at a hospital or doctor's office? Y. Occurred at hospital When did it last happen?Over 2 Years Ago If all above answers are "NO", may proceed with cephalosporin use.     Social History:  reports that she has been smoking cigarettes. She started smoking about 61 years ago. She has a 30.00 pack-year smoking history. She has never used smokeless tobacco. She reports previous alcohol use. She reports that she does not use drugs.  Family History: Family History  Problem Relation Age of Onset  . Diabetes type I Child   . Stroke Brother   . Heart attack Mother   . Heart attack Father      Objective   Physical Exam: Blood pressure (!) 173/81, pulse 96, temperature 97.8 F (36.6 C), temperature source Oral, resp. rate (!) 22, height 5' 2"  (1.575 m), weight 36.7 kg, SpO2 100 %.  Physical Exam Vitals and nursing note reviewed. Exam conducted with a chaperone present.  Constitutional:      Appearance: Normal appearance.     Comments: Frail elderly female with slowed but appropriate speech  HENT:     Head: Normocephalic and atraumatic.  Eyes:     Conjunctiva/sclera: Conjunctivae normal.  Cardiovascular:     Rate and Rhythm: Normal rate and regular rhythm.  Pulmonary:     Effort: Pulmonary effort is normal.     Breath sounds: Normal breath sounds.  Abdominal:     General: Abdomen is flat.     Palpations: Abdomen is  soft.  Musculoskeletal:        General: No swelling or tenderness.  Skin:    Coloration: Skin is not jaundiced or pale.  Neurological:     Mental Status: She is alert. Mental status is at baseline.  Psychiatric:        Mood and Affect: Mood normal.        Behavior: Behavior normal.     LABS on Admission: I have personally reviewed all the labs and imaging below    Basic Metabolic Panel: Recent Labs  Lab 01/13/20 1201  NA 124*  K 3.8  CL  85*  CO2 26  GLUCOSE 95  BUN 24*  CREATININE 0.96  CALCIUM 12.5*   Liver Function Tests: Recent Labs  Lab 01/13/20 1201  AST 19  ALT 13  ALKPHOS 100  BILITOT 0.7  PROT 7.8  ALBUMIN 3.2*   Recent Labs  Lab 01/13/20 1201  LIPASE 20   No results for input(s): AMMONIA in the last 168 hours. CBC: Recent Labs  Lab 01/13/20 1201  WBC 11.2*  NEUTROABS 9.8*  HGB 12.3  HCT 37.5  MCV 83.5  PLT 572*   Cardiac Enzymes: No results for input(s): CKTOTAL, CKMB, CKMBINDEX, TROPONINI in the last 168 hours. BNP: Invalid input(s): POCBNP CBG: No results for input(s): GLUCAP in the last 168 hours.  Radiological Exams on Admission:  DG Chest Port 1 View  Result Date: 01/13/2020 CLINICAL DATA:  Weakness. EXAM: PORTABLE CHEST 1 VIEW COMPARISON:  None. FINDINGS: The heart size and mediastinal contours are within normal limits. Both lungs are clear. The visualized skeletal structures are unremarkable. IMPRESSION: No active disease. Electronically Signed   By: Marijo Conception M.D.   On: 01/13/2020 11:58      EKG: Independently reviewed.    A & P   Principal Problem:   Failure to thrive in adult Active Problems:   Hypercalcemia   Hyponatremia   Essential hypertension   Anxiety   COPD with chronic bronchitis (HCC)   Lytic bone lesions on xray   1. Failure to thrive, multifactorial: Hypercalcemia, hyponatremia, concern for malignancy a. Treat underlying conditions as below  2. Hypercalcemia, likely from  malignancy a. Corrected calcium 13.1 b. Continue IV fluids  3. Hyponatremia a. Sodium 124 b. Continue IV fluids and avoid rapid correction c. Trend labs  4. Suspected malignancy a. Lytic lesion found as described in HPI b. Oncology consulted  5. Hypertension a. Continue home meds  6. Anxiety/depression a. Continue Zoloft  7. Hypothyroidism a. Continue Synthroid  8. COPD a. Continue albuterol and Spiriva  9. Dysphagia a. SLP Eval   DVT prophylaxis: Lovenox   Code Status: DNR  Diet: Full liquid Family Communication: Admission, patients condition and plan of care including tests being ordered have been discussed with the patient who indicates understanding and agrees with the plan and Code Status. Patient's daughter was updated  Disposition Plan: The appropriate patient status for this patient is INPATIENT. Inpatient status is judged to be reasonable and necessary in order to provide the required intensity of service to ensure the patient's safety. The patient's presenting symptoms, physical exam findings, and initial radiographic and laboratory data in the context of their chronic comorbidities is felt to place them at high risk for further clinical deterioration. Furthermore, it is not anticipated that the patient will be medically stable for discharge from the hospital within 2 midnights of admission. The following factors support the patient status of inpatient.   " The patient's presenting symptoms include Generalized weakness constipation. " The worrisome physical exam findings include slowed speech, frail. " The initial radiographic and laboratory data are worrisome because of hypercalcemia, hyponatremia. " The chronic co-morbidities include suspected malignancy.   * I certify that at the point of admission it is my clinical judgment that the patient will require inpatient hospital care spanning beyond 2 midnights from the point of admission due to high intensity of  service, high risk for further deterioration and high frequency of surveillance required.*     Consultants  . Oncology  Procedures  . None  Time Spent on Admission:  86 minutes    Harold Hedge, DO [facility]  01/13/2020, 6:43 PM

## 2020-01-13 NOTE — ED Notes (Signed)
X-ray at bedside

## 2020-01-13 NOTE — Telephone Encounter (Signed)
Received call from patient's daughter, Hailey Moody.  She states her mother has been declining over the past few days. This morning she is unable to get her out of bed. Pt tells her daughter that she is very weak, hurts all over, did not sleep well last night.  She has been feeling quite nauseated but no recent vomiting. Denies fever, chills. She is constipated-no BM in at least 5 days. Her eating remains difficult but is drinking water well. Urine remains clear and light in color. Hailey Moody is asking if she should take her to the ED. Discussed with Dr. Leonides Schanz. He did advise that pt go to the ED, either by private vehicle if the family can get her to the care or EMS if they are unable to transport her. Spoke with Hailey Moody again and relayed Dr. Dr.Dorsey's advise. She thinks that she and her brother will be able to bring her to [facility] ED today. Advised that Dr. Leonides Schanz would follow up with her there.

## 2020-01-13 NOTE — ED Provider Notes (Signed)
[facility]-EMERGENCY DEPT Provider Note   CSN: [id number] Arrival date & time: 01/13/20  1116     History Chief Complaint  Patient presents with  . Weakness  . Emesis    Hailey Moody is a 74 y.o. female.  Patient presents with weakness.  Patient has been seen by oncology for possible lytic lesions and she was scheduled for a PET scan tomorrow.  She feels so weak she came to the emergency department today  The history is provided by the patient and medical records. No language interpreter was used.  Weakness Severity:  Moderate Onset quality:  Sudden Timing:  Constant Progression:  Worsening Chronicity:  Recurrent Context: not alcohol use   Relieved by:  Nothing Worsened by:  Nothing Associated symptoms: no abdominal pain, no chest pain, no cough, no diarrhea, no frequency, no headaches and no seizures   Emesis Associated symptoms: no abdominal pain, no cough, no diarrhea and no headaches        Past Medical History:  Diagnosis Date  . Arthritis   . Borderline diabetes   . COPD (chronic obstructive pulmonary disease) (HCC)    on spiriva, nebulizer  . Essential hypertension   . Fatigue    unspecified  . Heart attack (HCC)    "mild"  . Hyperlipidemia   . Hypothyroidism   . Mini stroke (HCC)   . Vitamin D deficiency     Patient Active Problem List   Diagnosis Date Noted  . New daily persistent headache 11/06/2019  . Dysphagia 11/06/2019  . Cervical radiculopathy 11/06/2019  . Cervico-occipital neuralgia 11/06/2019  . Cervicalgia 11/06/2019  . Chronic neck pain 11/06/2019    Past Surgical History:  Procedure Laterality Date  . temporal artery biopsy Left 10/02/2019     OB History   No obstetric history on file.     Family History  Problem Relation Age of Onset  . Diabetes type I Child   . Stroke Brother   . Heart attack Mother   . Heart attack Father     Social History   Tobacco Use  . Smoking status: Current Every  Day Smoker    Packs/day: 0.50    Years: 60.00    Pack years: 30.00    Types: Cigarettes    Start date: 64  . Smokeless tobacco: Never Used  Substance Use Topics  . Alcohol use: Not Currently  . Drug use: Never    Home Medications Prior to Admission medications   Medication Sig Start Date End Date Taking? Authorizing Provider  albuterol (ACCUNEB) 1.25 MG/3ML nebulizer solution Take 3 mLs by nebulization. Three times per day as needed.    [provider]  Albuterol Sulfate (PROAIR HFA IN) Inhale 2 puffs into the lungs. Every 4-6 hours as needed.    [provider]  Alendronate Sodium (FOSAMAX PO) Take by mouth.    [provider]  amLODipine (NORVASC) 5 MG tablet Take 1 tablet (5 mg total) by mouth daily. 12/25/19   Parke Poisson, MD  busPIRone HCl (BUSPAR PO) Take by mouth. Patient not taking: Reported on 01/03/2020    [provider]  Calcium Carbonate-Vitamin D3 (CALCIUM 600/VITAMIN D) 600-400 MG-UNIT TABS Take 1 tablet by mouth in the morning and at bedtime. Patient not taking: Reported on 01/03/2020    [provider]  ergocalciferol (VITAMIN D2) 1.25 MG (50000 UT) capsule Take 50,000 Units by mouth once a week. On Tuesdays    [provider]  HYDROcodone-acetaminophen (  NORCO/VICODIN) 5-325 MG tablet Take 1 tablet by mouth every 8 (eight) hours.  Patient not taking: Reported on 01/03/2020    [provider]  levothyroxine (SYNTHROID) 125 MCG tablet Take 125 mcg by mouth daily. 10/16/19   [provider]  losartan (COZAAR) 100 MG tablet Take 100 mg by mouth daily. 08/21/19   [provider]  metoprolol tartrate (LOPRESSOR) 25 MG tablet Take 25 mg by mouth daily. 08/21/19   [provider]  omeprazole (PRILOSEC) 20 MG capsule Take 20 mg by mouth daily. Patient not taking: Reported on 01/03/2020 09/17/19   [provider]  sertraline (ZOLOFT) 100 MG tablet Take 100 mg by mouth daily.      [provider]  SPIRIVA HANDIHALER 18 MCG inhalation capsule Place 1 capsule into inhaler and inhale daily. 10/16/19   [provider]  traMADol (ULTRAM) 50 MG tablet Take 1 tablet (50 mg total) by mouth every 6 (six) hours as needed. 01/03/20   Jaci Standard, MD    Allergies    Penicillins  Review of Systems   Review of Systems  Constitutional: Negative for appetite change and fatigue.  HENT: Negative for congestion, ear discharge and sinus pressure.   Eyes: Negative for discharge.  Respiratory: Negative for cough.   Cardiovascular: Negative for chest pain.  Gastrointestinal: Negative for abdominal pain and diarrhea.  Genitourinary: Negative for frequency and hematuria.  Musculoskeletal: Negative for back pain.  Skin: Negative for rash.  Neurological: Positive for weakness. Negative for seizures and headaches.  Psychiatric/Behavioral: Negative for hallucinations.    Physical Exam Updated Vital Signs BP (!) 165/85   Pulse 91   Temp 97.8 F (36.6 C) (Oral)   Resp 19   Ht 5\' 2"  (1.575 m)   Wt 36.7 kg   SpO2 95%   BMI 14.82 kg/m   Physical Exam Vitals and nursing note reviewed.  Constitutional:      Appearance: She is well-developed.  HENT:     Head: Normocephalic.     Comments: Dry mucous membranes    Nose: Nose normal.  Eyes:     General: No scleral icterus.    Conjunctiva/sclera: Conjunctivae normal.  Neck:     Thyroid: No thyromegaly.  Cardiovascular:     Rate and Rhythm: Normal rate and regular rhythm.     Heart sounds: No murmur heard.  No friction rub. No gallop.   Pulmonary:     Breath sounds: No stridor. No wheezing or rales.  Chest:     Chest wall: No tenderness.  Abdominal:     General: There is no distension.     Tenderness: There is abdominal tenderness. There is no rebound.  Musculoskeletal:        General: Normal range of motion.     Cervical back: Neck supple.  Lymphadenopathy:     Cervical: No cervical adenopathy.    Skin:    Findings: No erythema or rash.  Neurological:     Mental Status: She is oriented to person, place, and time.     Motor: No abnormal muscle tone.     Coordination: Coordination normal.  Psychiatric:        Behavior: Behavior normal.     ED Results / Procedures / Treatments   Labs (all labs ordered are listed, but only abnormal results are displayed) Labs Reviewed  COMPREHENSIVE METABOLIC PANEL - Abnormal; Notable for the following components:      Result Value   Sodium 124 (*)  Chloride 85 (*)    BUN 24 (*)    Calcium 12.5 (*)    Albumin 3.2 (*)    GFR, Estimated 58 (*)    All other components within normal limits  CBC - Abnormal; Notable for the following components:   WBC 11.2 (*)    Platelets 572 (*)    All other components within normal limits  DIFFERENTIAL - Abnormal; Notable for the following components:   Neutro Abs 9.8 (*)    Lymphs Abs 0.5 (*)    All other components within normal limits  LIPASE, BLOOD  URINALYSIS, ROUTINE W REFLEX MICROSCOPIC    EKG None  Radiology DG Chest Port 1 View  Result Date: 01/13/2020 CLINICAL DATA:  Weakness. EXAM: PORTABLE CHEST 1 VIEW COMPARISON:  None. FINDINGS: The heart size and mediastinal contours are within normal limits. Both lungs are clear. The visualized skeletal structures are unremarkable. IMPRESSION: No active disease. Electronically Signed   By: Lupita Raider M.D.   On: 01/13/2020 11:58    Procedures Procedures (including critical care time)  Medications Ordered in ED Medications  sodium chloride 0.9 % bolus 1,000 mL (0 mLs Intravenous Stopped 01/13/20 1353)    ED Course  I have reviewed the triage vital signs and the nursing notes.  Pertinent labs & imaging results that were available during my care of the patient were reviewed by me and considered in my medical decision making (see chart for details).    MDM Rules/Calculators/A&P                          Patient with dehydration and  hypercalcemia along with hyponatremia.  I spoke with oncology and they will see the patient tomorrow and requested medicine admission      This patient presents to the ED for concern of weakness, this involves an extensive number of treatment options, and is a complaint that carries with it a high risk of complications and morbidity.  The differential diagnosis includes dehydration metastatic cancer   Lab Tests:  I Ordered, reviewed, and interpreted labs, which included CBC chemistries which show hypercalcemia and hyponatremia Medicines ordered:   I ordered medication normal saline for dehydration  Imaging Studies ordered:   I ordered imaging studies which included chest x-ray  I independently visualized and interpreted imaging which showed unremarkable  Additional history obtained:   Additional history obtained from daughter  Previous records obtained and reviewed.  Consultations Obtained:   I consulted oncology and hospitalist and discussed lab and imaging findings  Reevaluation:  After the interventions stated above, I reevaluated the patient and found improved  Critical Interventions:  .   Final Clinical Impression(s) / ED Diagnoses Final diagnoses:  Dehydration  Weakness    Rx / DC Orders ED Discharge Orders    None       Bethann Berkshire, MD 01/13/20 1550

## 2020-01-14 ENCOUNTER — Inpatient Hospital Stay (HOSPITAL_COMMUNITY): Payer: Medicare PPO

## 2020-01-14 ENCOUNTER — Ambulatory Visit (HOSPITAL_COMMUNITY): Payer: Medicare PPO

## 2020-01-14 ENCOUNTER — Encounter (HOSPITAL_COMMUNITY): Payer: Self-pay | Admitting: Internal Medicine

## 2020-01-14 DIAGNOSIS — R531 Weakness: Secondary | ICD-10-CM

## 2020-01-14 DIAGNOSIS — E860 Dehydration: Secondary | ICD-10-CM | POA: Diagnosis not present

## 2020-01-14 DIAGNOSIS — R627 Adult failure to thrive: Secondary | ICD-10-CM

## 2020-01-14 DIAGNOSIS — J449 Chronic obstructive pulmonary disease, unspecified: Secondary | ICD-10-CM | POA: Diagnosis not present

## 2020-01-14 DIAGNOSIS — L8990 Pressure ulcer of unspecified site, unspecified stage: Secondary | ICD-10-CM | POA: Insufficient documentation

## 2020-01-14 DIAGNOSIS — R1310 Dysphagia, unspecified: Secondary | ICD-10-CM

## 2020-01-14 DIAGNOSIS — F419 Anxiety disorder, unspecified: Secondary | ICD-10-CM | POA: Diagnosis not present

## 2020-01-14 LAB — COMPREHENSIVE METABOLIC PANEL
ALT: 13 U/L (ref 0–44)
AST: 16 U/L (ref 15–41)
Albumin: 2.6 g/dL — ABNORMAL LOW (ref 3.5–5.0)
Alkaline Phosphatase: 82 U/L (ref 38–126)
Anion gap: 12 (ref 5–15)
BUN: 20 mg/dL (ref 8–23)
CO2: 24 mmol/L (ref 22–32)
Calcium: 10.5 mg/dL — ABNORMAL HIGH (ref 8.9–10.3)
Chloride: 90 mmol/L — ABNORMAL LOW (ref 98–111)
Creatinine, Ser: 0.8 mg/dL (ref 0.44–1.00)
GFR, Estimated: >60 mL/min (ref >60)
Glucose, Bld: 84 mg/dL (ref 70–99)
Potassium: 3.1 mmol/L — ABNORMAL LOW (ref 3.5–5.1)
Sodium: 126 mmol/L — ABNORMAL LOW (ref 135–145)
Total Bilirubin: 1 mg/dL (ref 0.3–1.2)
Total Protein: 6.4 g/dL — ABNORMAL LOW (ref 6.5–8.1)

## 2020-01-14 LAB — CBC
HCT: 29.2 % — ABNORMAL LOW (ref 36.0–46.0)
Hemoglobin: 10 g/dL — ABNORMAL LOW (ref 12.0–15.0)
MCH: 28.1 pg (ref 26.0–34.0)
MCHC: 34.2 g/dL (ref 30.0–36.0)
MCV: 82 fL (ref 80.0–100.0)
Platelets: 496 10*3/uL — ABNORMAL HIGH (ref 150–400)
RBC: 3.56 MIL/uL — ABNORMAL LOW (ref 3.87–5.11)
RDW: 13.3 % (ref 11.5–15.5)
WBC: 9.4 10*3/uL (ref 4.0–10.5)
nRBC: 0 % (ref 0.0–0.2)

## 2020-01-14 LAB — GLUCOSE, CAPILLARY
Glucose-Capillary: 81 mg/dL (ref 70–99)
Glucose-Capillary: 84 mg/dL (ref 70–99)

## 2020-01-14 LAB — MAGNESIUM: Magnesium: 1.3 mg/dL — ABNORMAL LOW (ref 1.7–2.4)

## 2020-01-14 LAB — VITAMIN D 25 HYDROXY (VIT D DEFICIENCY, FRACTURES): Vit D, 25-Hydroxy: 69.66 ng/mL (ref 30–100)

## 2020-01-14 LAB — PHOSPHORUS: Phosphorus: 2.4 mg/dL — ABNORMAL LOW (ref 2.5–4.6)

## 2020-01-14 MED ORDER — ENOXAPARIN SODIUM 30 MG/0.3ML ~~LOC~~ SOLN
30.0000 mg | SUBCUTANEOUS | Status: DC
  Administered 2020-01-15 – 2020-01-16 (×2): 30 mg via SUBCUTANEOUS
  Filled 2020-01-14 (×2): qty 0.3

## 2020-01-14 MED ORDER — ORAL CARE MOUTH RINSE
15.0000 mL | Freq: Two times a day (BID) | OROMUCOSAL | Status: DC
  Administered 2020-01-15 – 2020-01-17 (×4): 15 mL via OROMUCOSAL

## 2020-01-14 MED ORDER — SODIUM CHLORIDE 0.9 % IV SOLN
INTRAVENOUS | Status: DC
  Administered 2020-01-16: 1000 mL via INTRAVENOUS

## 2020-01-14 MED ORDER — SODIUM CHLORIDE 0.9 % IV BOLUS
1000.0000 mL | Freq: Once | INTRAVENOUS | Status: AC
  Administered 2020-01-14: 1000 mL via INTRAVENOUS

## 2020-01-14 MED ORDER — POTASSIUM PHOSPHATES 15 MMOLE/5ML IV SOLN
30.0000 mmol | Freq: Once | INTRAVENOUS | Status: AC
  Administered 2020-01-14: 30 mmol via INTRAVENOUS
  Filled 2020-01-14: qty 10

## 2020-01-14 MED ORDER — CHLORHEXIDINE GLUCONATE 0.12 % MT SOLN
15.0000 mL | Freq: Two times a day (BID) | OROMUCOSAL | Status: DC
  Administered 2020-01-14 – 2020-01-17 (×6): 15 mL via OROMUCOSAL
  Filled 2020-01-14 (×6): qty 15

## 2020-01-14 MED ORDER — POTASSIUM CHLORIDE CRYS ER 20 MEQ PO TBCR: 40.0000 meq | EXTENDED_RELEASE_TABLET | ORAL | Status: DC

## 2020-01-14 MED ORDER — POTASSIUM CHLORIDE 20 MEQ PO PACK
40.0000 meq | PACK | ORAL | Status: AC
  Administered 2020-01-14 (×2): 40 meq via ORAL
  Filled 2020-01-14 (×2): qty 2

## 2020-01-14 MED ORDER — ZOLEDRONIC ACID 4 MG/5ML IV CONC
4.0000 mg | Freq: Once | INTRAVENOUS | Status: AC
  Administered 2020-01-14: 4 mg via INTRAVENOUS
  Filled 2020-01-14: qty 5

## 2020-01-14 MED ORDER — MAGNESIUM SULFATE 4 GM/100ML IV SOLN
4.0000 g | Freq: Once | INTRAVENOUS | Status: AC
  Administered 2020-01-14: 4 g via INTRAVENOUS
  Filled 2020-01-14: qty 100

## 2020-01-14 NOTE — Progress Notes (Signed)
  Speech Language Pathology Treatment: Dysphagia  Patient Details Name: Hailey Moody MRN: [id number] DOB: [birth year] Today's Date: 01/14/2020 Time: 8588-5027 SLP Time Calculation (min) (ACUTE ONLY): 15 min  Assessment / Plan / Recommendation Clinical Impression  SLP visit to provide handled cup to maximize pt's autonomy with liquid intake and to mitigate aspiration given pt coughing with large bore straw and demonstrates significant delay in swallow.  Pt able to hold her own cup and consume small amounts of coffee without report of odynophagia nor overt coughing.  SLP placed her in reverse trendelenberg given her decreased comfort with sitting upright.    Recommend continue full liquid diet as pt consumes liquids only at home due to her dysphagia.  Results of limited esophagram were in chart and only dysmotility noted.  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 due to potential multiple myeloma.  Note plans for bone marrow biospy tomorrow, therefore will follow up this week re: dysphagia.     HPI HPI: 74 yo female adm to [facility] with h/o COPD, HTN, HLD, arthritis, recently had a CT of her neck on 12/25/2019 showing a multifocal lytic osseous lesion at left skull base, C2 vertebrae and right scapula and left lymphadenopathy.  Pt reports progressive dysphagia over the last few months - to the point where she is only consuming [employer].  Pt reports everything is "burning" when she swallows.      SLP Plan  Continue with current plan of care       Recommendations  Diet recommendations: Thin liquid;Nectar-thick liquid (full liquids) Liquids provided via: Cup;No straw Medication Administration: Whole meds with puree (prefer suspension) Supervision: Patient able to self feed Compensations: Slow rate;Small sips/bites Postural Changes and/or Swallow Maneuvers: Seated upright 90 degrees;Upright 30-60 min after meal                 Oral Care Recommendations: Oral care QID Follow up Recommendations: None SLP Visit Diagnosis: Dysphagia, pharyngeal phase (R13.13) Plan: Continue with current plan of care       GO               Kathleen Lime, MS [place] Office [phone] Pager [phone]   Macario Golds 01/14/2020, 7:17 PM

## 2020-01-14 NOTE — Telephone Encounter (Signed)
Responded to daughter's message by phone this am.  Daughter is concerned about getting scan done.  Called radiology & was informed that PET cannot be done inpt b/c insurance will not cover.  Informed daughter.

## 2020-01-14 NOTE — Consult Note (Signed)
Chief Complaint: Patient was seen in consultation today for CT guided bone marrow biopsy Chief Complaint  Patient presents with   Weakness   Emesis    Referring Physician(s): Dorsey,J  Supervising Physician: Aletta Edouard  Patient Status: [facility] - In-pt  History of Present Illness: Hailey Moody is a 74 y.o. female with past medical history of arthritis, borderline diabetes, COPD, hypertension, prior MI, hyperlipidemia, hypothyroidism, TIA, vitamin D deficiency and newly noted multifocal lytic osseous lesions of the left skull base, C2 vertebra and right scapula.  She also has elevated free kappa and lambda light chains on SPEP/UPEP . She was admitted to [facility] on 01/13/20 with  hypercalcemia, failure to thrive, weight loss, poor oral intake/dysphagia, weakness, back pain, vomiting, headaches.  She was previously scheduled for CT-guided bone marrow biopsy as outpatient on 11/2 to rule out myeloma.  Request now received to perform bone marrow biopsy while inpatient.  Past Medical History:  Diagnosis Date   Arthritis    Borderline diabetes    COPD (chronic obstructive pulmonary disease) (HCC)    on spiriva, nebulizer   Essential hypertension    Fatigue    unspecified   Heart attack ([place])    "mild"   Hyperlipidemia    Hypothyroidism    Mini stroke (HCC)    Vitamin D deficiency     Past Surgical History:  Procedure Laterality Date   temporal artery biopsy Left 10/02/2019    Allergies: Penicillins  Medications: Prior to Admission medications   Medication Sig Start Date End Date Taking? Authorizing Provider  albuterol (ACCUNEB) 1.25 MG/3ML nebulizer solution Take 3 mLs by nebulization every 6 (six) hours as needed for wheezing or shortness of breath. Three times per day as needed.   Yes [provider]  albuterol (VENTOLIN HFA) 108 (90 Base) MCG/ACT inhaler Inhale 2 puffs into the lungs every 6 (six) hours as needed for wheezing or  shortness of breath.   Yes [provider]  alendronate (FOSAMAX) 70 MG tablet Take 70 mg by mouth once a week. Take with a full glass of water on an empty stomach. Take on Mondays   Yes [provider]  amLODipine (NORVASC) 5 MG tablet Take 1 tablet (5 mg total) by mouth daily. 12/25/19  Yes Elouise Munroe, MD  ergocalciferol (VITAMIN D2) 1.25 MG (50000 UT) capsule Take 50,000 Units by mouth once a week. On Wednesdays   Yes [provider]  levothyroxine (SYNTHROID) 125 MCG tablet Take 125 mcg by mouth daily. 10/16/19  Yes [provider]  losartan (COZAAR) 100 MG tablet Take 100 mg by mouth daily. 08/21/19  Yes [provider]  metoprolol tartrate (LOPRESSOR) 25 MG tablet Take 25 mg by mouth daily. 08/21/19  Yes [provider]  sertraline (ZOLOFT) 100 MG tablet Take 100 mg by mouth daily.    Yes [provider]  SPIRIVA HANDIHALER 18 MCG inhalation capsule Place 1 capsule into inhaler and inhale daily. 10/16/19  Yes [provider]  traMADol (ULTRAM) 50 MG tablet Take 1 tablet (50 mg total) by mouth every 6 (six) hours as needed. Patient taking differently: Take 50 mg by mouth every 6 (six) hours as needed for moderate pain.  01/03/20  Yes Orson Slick, MD     Family History  Problem Relation Age of Onset   Diabetes type I Child    Stroke Brother    Heart attack Mother    Heart attack Father  Social History   Socioeconomic History   Marital status: Single    Spouse name: Not on file   Number of children: Not on file   Years of education: Not on file   Highest education level: Not on file  Occupational History   Not on file  Tobacco Use   Smoking status: Current Every Day Smoker    Packs/day: 0.50    Years: 60.00    Pack years: 30.00    Types: Cigarettes    Start date: 9   Smokeless tobacco: Never Used  Scientific laboratory technician Use: Never used  Substance and Sexual Activity   Alcohol  use: Not Currently   Drug use: Never   Sexual activity: Not Currently  Other Topics Concern   Not on file  Social History Narrative   Lives with her grandson in [place] but currently here living with her daughter due to medical reasons   Right handed   Caffeine: about 2 cups/day lately, normally 5.   Social Determinants of Health   Financial Resource Strain:    Difficulty of Paying Living Expenses: Not on file  Food Insecurity:    Worried About Charity fundraiser in the Last Year: Not on file   [employer] of Food in the Last Year: Not on file  Transportation Needs:    Lack of Transportation (Medical): Not on file   Lack of Transportation (Non-Medical): Not on file  Physical Activity:    Days of Exercise per Week: Not on file   Minutes of Exercise per Session: Not on file  Stress:    Feeling of Stress : Not on file  Social Connections:    Frequency of Communication with Friends and Family: Not on file   Frequency of Social Gatherings with Friends and Family: Not on file   Attends Religious Services: Not on file   Active Member of Clubs or Organizations: Not on file   Attends Archivist Meetings: Not on file   Marital Status: Not on file      Review of Systems see above  Vital Signs: BP (!) 118/57 (BP Location: Left Arm)    Pulse 65    Temp (!) 97.4 F (36.3 C) (Oral)    Resp 18    Ht _0  (1.575 m)    Wt 81 lb (36.7 kg)    SpO2 98%    BMI 14.82 kg/m   Physical Exam cachectic appearing white female, thin/frail ; patient awake, answers simple questions okay in soft voice; chest clear to auscultation bilaterally.  Heart with regular rate and rhythm.  Abdomen soft, positive bowel sounds, nontender.  No lower extremity edema.  Imaging: CT Soft Tissue Neck W Contrast  Addendum Date: 12/25/2019   ADDENDUM REPORT: 12/25/2019 08:01 ADDENDUM: Study discussed by telephone with Dr. Melony Overly on 12/24/2019 at 1430 hours. He advised that it was in  fact the Left vocal cord was paralyzed on his exam - which is concordant with the identified left skull base and/or left thoracic inlet lesions. Electronically Signed   By: Genevie Ann M.D.   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 EXAM: CT NECK WITH CONTRAST TECHNIQUE: Multidetector CT imaging of the neck was performed using the standard protocol following the bolus administration of intravenous contrast. CONTRAST:  13m ISOVUE-300 IOPAMIDOL (ISOVUE-300) INJECTION 61% COMPARISON:  Cervical spine and brain MRI 11/20/2019. FINDINGS: Pharynx and larynx:  Mild motion artifact at the larynx, but evidence of mild laryngeal asymmetry on the right including asymmetric enlargement of the right piriform sinus. No discrete laryngeal mass or hyperenhancement. Pharyngeal soft tissue contours are within normal limits. Negative parapharyngeal and retropharyngeal spaces. Unremarkable visible cervical esophagus. Salivary glands: Sublingual space, submandibular glands and parotid glands are within normal limits. Thyroid: Diminutive or absent, no definite thyroid parenchyma identified. Lymph nodes: Subcentimeter but heterogeneous and conspicuous left level 4 lymph nodes are best demonstrated on series 10, image 57. Individually these are 8 mm short axis (about 16 mm long axis, with central hypodensity. However, no other cervical lymphadenopathy identified. Vascular: Bulky bilateral cervical carotid calcified atherosclerosis, including high-grade stenoses at the bilateral distal ICA bulbs, radiographic string sign on the left (series 2, image 47) and approaching a string sign on the right (image 44). None the less, the major vascular structures in the neck and at the skull base are patent. Limited intracranial: Small 12 mm enhancing mass projecting into the lower left posterior fossa abutting the cerebellum (series 2, image 18),  corresponding to a finding described on the brain MRI last month. However, see skeletal findings below. Visualized orbits: Negative. Mastoids and visualized paranasal sinuses: Visible paranasal sinuses are clear. Tympanic cavities remain clear. However, there is a left mastoid effusion associated with destruction of the left skull base, see below. Skeleton: Lytic lesions in the left lower clivus (series 4, image 15), left occipital condyle, inferior left mastoid bone C2 posterior elements (series 4, image 38), lateral right scapula (series 4, image 91). Extraosseous extension of tumor from the C2 spinous process (series 2, image 39). Pathologic fracture of the lateral right scapula. Indeterminate moderate compression fracture of T6. Mild T3 superior endplate compression. Upper chest: Emphysema. Small 7 mm lung nodule in the anterior right lower lobe on series 8, image 48. Most of the lungs are not included. Calcified aortic atherosclerosis. Subcentimeter but indeterminate left AP window lymph node measuring 8 mm on series 2, image 116. Other: Cachexia. IMPRESSION: 1. Positive for multifocal lytic osseous lesions: left skull base, C2 vertebra, and right scapula. In conjunction with left level IV lymphadenopathy (see #2), favor metastatic disease undetermined primary (see #3). Pathologic fracture right scapula. Indeterminate compression fractures of T3 and T6. 2. Small but malignant appearing lymph nodes at the left level IV station. These along with the left skull base destruction could predispose to left vocal cord paralysis, although the asymmetry of the motion degraded larynx appears more compatible with a right side cord paralysis. No right side causative lesion is identified. 3. Emphysema (ICD10-J43.9) with a suspicious 7 mm right lower lobe pulmonary nodule in the visible lungs. Small but suspicious left AP window lymph node. 4. Severe calcified atherosclerosis of both ICAs, with Severe bilateral ICA bulb  stenoses. Aortic Atherosclerosis (ICD10-I70.0). Electronically Signed: By: Genevie Ann M.D. On: 12/24/2019 10:38   DG Chest Port 1 View  Result Date: 01/13/2020 CLINICAL DATA:  Weakness. EXAM: PORTABLE CHEST 1 VIEW COMPARISON:  None. FINDINGS: The heart size and mediastinal contours are within normal limits. Both lungs are clear. The visualized skeletal structures are unremarkable. IMPRESSION: No active disease. Electronically Signed   By: Marijo Conception M.D.   On: 01/13/2020 11:58    Labs:  CBC: Recent Labs    11/06/19 1520 01/03/20 1035 01/13/20 1201 01/14/20 0502  WBC 9.5 11.9* 11.2* 9.4  HGB 13.2 11.3* 12.3 10.0*  HCT 39.0 33.6* 37.5 29.2*  PLT 486* 512* 572* 496*  COAGS: No results for input(s): INR, APTT in the last 8760 hours.  BMP: Recent Labs    11/06/19 1520 01/03/20 1035 01/13/20 1201 01/14/20 0859  NA 129* 126* 124* 126*  K 4.3 4.5 3.8 3.1*  CL 88* 86* 85* 90*  CO2 26 32 26 24  GLUCOSE 87 92 95 84  BUN 16 20 24* 20  CALCIUM 10.1 11.7* 12.5* 10.5*  CREATININE 0.87 0.82 0.96 0.80  GFRNONAA 65 >60 58* >60  GFRAA 75  --   --   --     LIVER FUNCTION TESTS: Recent Labs    11/06/19 1520 01/03/20 1035 01/13/20 1201 01/14/20 0859  BILITOT 0.4 0.5 0.7 1.0  AST _0 ALT _1 ALKPHOS 124* 105 100 82  PROT 7.1 7.6 7.8 6.4*  ALBUMIN 4.1 2.7* 3.2* 2.6*    TUMOR MARKERS: No results for input(s): AFPTM, CEA, CA199, CHROMGRNA in the last 8760 hours.  Assessment and Plan:  74 y.o. female with past medical history of arthritis, borderline diabetes, COPD, hypertension, prior MI, hyperlipidemia, hypothyroidism, TIA, vitamin D deficiency and newly noted multifocal lytic osseous lesions of the left skull base, C2 vertebra and right scapula.  She also has elevated free kappa and lambda light chains on SPEP/UPEP . She was admitted to [facility] on 01/13/20 with  hypercalcemia, failure to thrive, weight loss, poor oral intake/dysphagia,  weakness, back pain, vomiting, headaches.  She was previously scheduled for CT-guided bone marrow biopsy as outpatient on 11/2 to rule out myeloma.  Request now received to perform bone marrow biopsy while inpatient.Risks and benefits of procedure was discussed with the patient/daughter including, but not limited to bleeding, infection, damage to adjacent structures or low yield requiring additional tests.  All of the questions were answered and there is agreement to proceed.  Consent signed and in chart.  Procedure scheduled for 10/20 am   Thank you for this interesting consult.  I greatly enjoyed meeting ERMALEE MEALY and look forward to participating in their care.  A copy of this report was sent to the requesting provider on this date.  Electronically Signed: D. Rowe Robert, PA-C 01/14/2020, 4:18 PM   I spent a total of 25 minutes in face to face in clinical consultation, greater than 50% of which was counseling/coordinating care for CT-guided bone marrow biopsy

## 2020-01-14 NOTE — Consult Note (Signed)
[place] [phone]   Fax:([phone] [place] NOTE  Patient Care Team: Donald Prose, MD as PCP - General (Family Medicine)  Hematological/Oncological History # Lytic Bone Lesions 1) 12/25/2019: CT soft tissue neck performed revealed multifocal lytic osseous lesions: left skull base, C2 vertebra, and right scapula. In conjunction with left level IV lymphadenopathy 2) 01/03/2020: establish care with Dr. Lorenso Courier  3) 01/13/2020: admitted for failure to thrive  CHIEF COMPLAINTS/PURPOSE OF CONSULTATION:  "Hypercalcemia/Failure to Thrive/Poor PO Intake "  HISTORY OF PRESENTING ILLNESS:  Hailey Moody 74 y.o. female with medical history significant for ongoing oncological workup for lytic lesions of the skull and vertebrae who presents with worsening deconditioning.   On review of the previous records Hailey Moody (the patient's daughter) called our clinic on 01/13/2020 noting that her mother had been declining over this past several days.  She reports that she was unable to get out of bed and she was very weak and hurt all over.  She was also having issues with nausea.  Due to these findings he recommended that the patient be taken to the emergency department for further evaluation.  On arrival to the emergency department the patient was found to have a white blood cell count 11.2, hemoglobin 12.3, platelet count of 572, as well as a sodium of 124, creatinine of 0.96, and a calcium of 12.5 with an albumin of 3.2.  Due to concern for these findings the patient was admitted for further hydration evaluation.  Of note our prior work-up on 01/03/2020 did show a IgM kappa M protein of 0.5.  She had a modest elevation in her serum free light chain ratio at 1.75 despite her normal kidney function.  On exam today Hailey Moody is accompanied by her daughter at bedside.  Her daughter reports that she has been confused and continued to have difficulty  swallowing.  She reports that she tried some water and chicken broth but unfortunately vomited this back up.  She also notes that her mother has been quite confused and has not been answering questions appropriately.  She notes that she has been predominantly bedbound at home and only occasionally sits up at the side of the bed due to pain in her back.  She is also concerned that she is unable to take care of her mother and this markedly deconditioned state.  On arrival to the emergency department the patient was asked questions regarding resuscitation and requested to be a DNR.  She denies having infectious symptoms such as fevers, chills, sweats, diarrhea, cough, or other concerning symptoms.  A full 10 point ROS is listed below.  MEDICAL HISTORY:  Past Medical History:  Diagnosis Date  . Arthritis   . Borderline diabetes   . COPD (chronic obstructive pulmonary disease) (HCC)    on spiriva, nebulizer  . Essential hypertension   . Fatigue    unspecified  . Heart attack ([place])    "mild"  . Hyperlipidemia   . Hypothyroidism   . Mini stroke ([place])   . Vitamin D deficiency     SURGICAL HISTORY: Past Surgical History:  Procedure Laterality Date  . temporal artery biopsy Left 10/02/2019    SOCIAL HISTORY: Social History   Socioeconomic History  . Marital status: Single    Spouse name: Not on file  . Number of children: Not on file  . Years of education: Not on file  . Highest education level: Not on file  Occupational History  .  Not on file  Tobacco Use  . Smoking status: Current Every Day Smoker    Packs/day: 0.50    Years: 60.00    Pack years: 30.00    Types: Cigarettes    Start date: 12  . Smokeless tobacco: Never Used  Vaping Use  . Vaping Use: Never used  Substance and Sexual Activity  . Alcohol use: Not Currently  . Drug use: Never  . Sexual activity: Not Currently  Other Topics Concern  . Not on file  Social History Narrative   Lives with her grandson in  [place] but currently here living with her daughter due to medical reasons   Right handed   Caffeine: about 2 cups/day lately, normally 5.   Social Determinants of Health   Financial Resource Strain:   . Difficulty of Paying Living Expenses: Not on file  Food Insecurity:   . Worried About Charity fundraiser in the Last Year: Not on file  . Ran Out of Food in the Last Year: Not on file  Transportation Needs:   . Lack of Transportation (Medical): Not on file  . Lack of Transportation (Non-Medical): Not on file  Physical Activity:   . Days of Exercise per Week: Not on file  . Minutes of Exercise per Session: Not on file  Stress:   . Feeling of Stress : Not on file  Social Connections:   . Frequency of Communication with Friends and Family: Not on file  . Frequency of Social Gatherings with Friends and Family: Not on file  . Attends Religious Services: Not on file  . Active Member of Clubs or Organizations: Not on file  . Attends Archivist Meetings: Not on file  . Marital Status: Not on file  Intimate Partner Violence:   . Fear of Current or Ex-Partner: Not on file  . Emotionally Abused: Not on file  . Physically Abused: Not on file  . Sexually Abused: Not on file    FAMILY HISTORY: Family History  Problem Relation Age of Onset  . Diabetes type I Child   . Stroke Brother   . Heart attack Mother   . Heart attack Father     ALLERGIES:  is allergic to penicillins.  MEDICATIONS:  Current Facility-Administered Medications  Medication Dose Route Frequency Provider Last Rate Last Admin  . 0.9 %  sodium chloride infusion   Intravenous Continuous Eugenie Filler, MD 125 mL/hr at 01/14/20 0919 Rate Change at 01/14/20 0919  . acetaminophen (TYLENOL) tablet 650 mg  650 mg Oral Q6H PRN Harold Hedge, MD       Or  . acetaminophen (TYLENOL) suppository 650 mg  650 mg Rectal Q6H PRN Harold Hedge, MD      . albuterol (VENTOLIN HFA) 108 (90 Base) MCG/ACT inhaler 2 puff   2 puff Inhalation Q6H PRN Harold Hedge, MD      . amLODipine (NORVASC) tablet 5 mg  5 mg Oral Daily Harold Hedge, MD   5 mg at 01/14/20 0919  . docusate sodium (COLACE) capsule 100 mg  100 mg Oral BID Harold Hedge, MD   100 mg at 01/14/20 0919  . enoxaparin (LOVENOX) injection 30 mg  30 mg Subcutaneous Q24H Harold Hedge, MD   30 mg at 01/14/20 0004  . feeding supplement (ENSURE ENLIVE / ENSURE PLUS) liquid 237 mL  237 mL Oral BID BM Harold Hedge, MD   237 mL at 01/14/20 0925  . HYDROmorphone (DILAUDID)  injection 0.5 mg  0.5 mg Intravenous Q2H PRN Harold Hedge, MD      . levothyroxine (SYNTHROID) tablet 125 mcg  125 mcg Oral Daily Harold Hedge, MD   125 mcg at 01/14/20 2620  . metoprolol tartrate (LOPRESSOR) injection 5 mg  5 mg Intravenous Q6H PRN Harold Hedge, MD      . metoprolol tartrate (LOPRESSOR) tablet 25 mg  25 mg Oral Daily Harold Hedge, MD   25 mg at 01/14/20 0918  . sertraline (ZOLOFT) tablet 100 mg  100 mg Oral Daily Harold Hedge, MD   100 mg at 01/14/20 0919  . sodium chloride flush (NS) 0.9 % injection 3 mL  3 mL Intravenous Q12H Harold Hedge, MD      . traMADol Veatrice Bourbon) tablet 50 mg  50 mg Oral Q6H PRN Harold Hedge, MD   50 mg at 01/14/20 0958  . umeclidinium bromide (INCRUSE ELLIPTA) 62.5 MCG/INH 1 puff  1 puff Inhalation Daily Harold Hedge, MD   1 puff at 01/14/20 0800    REVIEW OF SYSTEMS:   Constitutional: ( - ) fevers, ( - )  chills , ( - ) night sweats Eyes: ( - ) blurriness of vision, ( - ) double vision, ( - ) watery eyes Ears, nose, mouth, throat, and face: ( - ) mucositis, ( - ) sore throat Respiratory: ( - ) cough, ( - ) dyspnea, ( - ) wheezes Cardiovascular: ( - ) palpitation, ( - ) chest discomfort, ( - ) lower extremity swelling Gastrointestinal:  ( - ) nausea, ( - ) heartburn, ( - ) change in bowel habits Skin: ( - ) abnormal skin rashes Lymphatics: ( - ) new lymphadenopathy, ( - ) easy bruising Neurological: ( - ) numbness, ( - )  tingling, ( - ) new weaknesses Behavioral/Psych: ( - ) mood change, ( - ) new changes  All other systems were reviewed with the patient and are negative.  PHYSICAL EXAMINATION: ECOG PERFORMANCE STATUS: 4 - Bedbound  Vitals:   01/14/20 0918 01/14/20 1029  BP: 140/80 133/61  Pulse: 80 71  Resp:    Temp:  98 F (36.7 C)  SpO2:  97%   Filed Weights   01/13/20 1124  Weight: 81 lb (36.7 kg)    GENERAL: chronically ill appearing elderly Caucasian female in NAD  SKIN: skin color, texture, turgor are normal, no rashes or significant lesions EYES: conjunctiva are pink and non-injected, sclera clear LUNGS: clear to auscultation and percussion with normal breathing effort HEART: regular rate & rhythm and no murmurs and no lower extremity edema ABDOMEN: soft, non-tender, non-distended, normal bowel sounds Musculoskeletal: no cyanosis of digits and no clubbing  PSYCH: alert & oriented x 3, fluent speech NEURO: no focal motor/sensory deficits  LABORATORY DATA:  I have reviewed the data as listed CBC Latest Ref Rng & Units 01/14/2020 01/13/2020 01/03/2020  WBC 4.0 - 10.5 K/uL 9.4 11.2(H) 11.9(H)  Hemoglobin 12.0 - 15.0 g/dL 10.0(L) 12.3 11.3(L)  Hematocrit 36 - 46 % 29.2(L) 37.5 33.6(L)  Platelets 150 - 400 K/uL 496(H) 572(H) 512(H)    CMP Latest Ref Rng & Units 01/14/2020 01/13/2020 01/03/2020  Glucose 70 - 99 mg/dL 84 95 92  BUN 8 - 23 mg/dL 20 24(H) 20  Creatinine 0.44 - 1.00 mg/dL 0.80 0.96 0.82  Sodium 135 - 145 mmol/L 126(L) 124(L) 126(L)  Potassium 3.5 - 5.1 mmol/L 3.1(L) 3.8 4.5  Chloride 98 - 111 mmol/L 90(L)  85(L) 86(L)  CO2 22 - 32 mmol/L 24 26 32  Calcium 8.9 - 10.3 mg/dL 10.5(H) 12.5(H) 11.7(H)  Total Protein 6.5 - 8.1 g/dL 6.4(L) 7.8 7.6  Total Bilirubin 0.3 - 1.2 mg/dL 1.0 0.7 0.5  Alkaline Phos 38 - 126 U/L 82 100 105  AST 15 - 41 U/L _0 ALT 0 - 44 U/L _1 RADIOGRAPHIC STUDIES: I have personally reviewed the radiological images as listed and  agreed with the findings in the report. CT Soft Tissue Neck W Contrast  Addendum Date: 12/25/2019   ADDENDUM REPORT: 12/25/2019 08:01 ADDENDUM: Study discussed by telephone with Dr. Melony Overly on 12/24/2019 at 1430 hours. 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 Electronically Signed   By: Genevie Ann M.D.   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 EXAM: CT NECK WITH CONTRAST TECHNIQUE: Multidetector CT imaging of the neck was performed using the standard protocol following the bolus administration of intravenous contrast. CONTRAST:  14m ISOVUE-300 IOPAMIDOL (ISOVUE-300) INJECTION 61% COMPARISON:  Cervical spine and brain MRI 11/20/2019. FINDINGS: Pharynx and larynx: Mild motion artifact at the larynx, but evidence of mild laryngeal asymmetry on the right including asymmetric enlargement of the right piriform sinus. No discrete laryngeal mass or hyperenhancement. Pharyngeal soft tissue contours are within normal limits. Negative parapharyngeal and retropharyngeal spaces. Unremarkable visible cervical esophagus. Salivary glands: Sublingual space, submandibular glands and parotid glands are within normal limits. Thyroid: Diminutive or absent, no definite thyroid parenchyma identified. Lymph nodes: Subcentimeter but heterogeneous and conspicuous left level 4 lymph nodes are best demonstrated on series 10, image 57. Individually these are 8 mm short axis (about 16 mm long axis, with central hypodensity. However, no other cervical lymphadenopathy identified. Vascular: Bulky bilateral cervical carotid calcified atherosclerosis, including high-grade stenoses at the bilateral distal ICA bulbs, radiographic string sign on the left (series 2, image 47) and approaching a string sign on  the right (image 44). None the less, the major vascular structures in the neck and at the skull base are patent. Limited intracranial: Small 12 mm enhancing mass projecting into the lower left posterior fossa abutting the cerebellum (series 2, image 18), corresponding to a finding described on the brain MRI last month. However, see skeletal findings below. Visualized orbits: Negative. Mastoids and visualized paranasal sinuses: Visible paranasal sinuses are clear. Tympanic cavities remain clear. However, there is a left mastoid effusion associated with destruction of the left skull base, see below. Skeleton: Lytic lesions in the left lower clivus (series 4, image 15), left occipital condyle, inferior left mastoid bone C2 posterior elements (series 4, image 38), lateral right scapula (series 4, image 91). Extraosseous extension of tumor from the C2 spinous process (series 2, image 39). Pathologic fracture of the lateral right scapula. Indeterminate moderate compression fracture of T6. Mild T3 superior endplate compression. Upper chest: Emphysema. Small 7 mm lung nodule in the anterior right lower lobe on series 8, image 48. Most of the lungs are not included. Calcified aortic atherosclerosis. Subcentimeter but indeterminate left AP window lymph node measuring 8 mm on series 2, image 116. Other: Cachexia. IMPRESSION: 1. Positive for multifocal lytic osseous lesions: left skull base, C2 vertebra, and right scapula. In conjunction with left level IV lymphadenopathy (see #2), favor  metastatic disease undetermined primary (see #3). Pathologic fracture right scapula. Indeterminate compression fractures of T3 and T6. 2. Small but malignant appearing lymph nodes at the left level IV station. These along with the left skull base destruction could predispose to left vocal cord paralysis, although the asymmetry of the motion degraded larynx appears more compatible with a right side cord paralysis. No right side causative lesion  is identified. 3. Emphysema (ICD10-J43.9) with a suspicious 7 mm right lower lobe pulmonary nodule in the visible lungs. Small but suspicious left AP window lymph node. 4. Severe calcified atherosclerosis of both ICAs, with Severe bilateral ICA bulb stenoses. Aortic Atherosclerosis (ICD10-I70.0). Electronically Signed: By: Genevie Ann M.D. On: 12/24/2019 10:38   DG Chest Port 1 View  Result Date: 01/13/2020 CLINICAL DATA:  Weakness. EXAM: PORTABLE CHEST 1 VIEW COMPARISON:  None. FINDINGS: The heart size and mediastinal contours are within normal limits. Both lungs are clear. The visualized skeletal structures are unremarkable. IMPRESSION: No active disease. Electronically Signed   By: Marijo Conception M.D.   On: 01/13/2020 11:58    ASSESSMENT & PLAN Hailey Moody 74 y.o. female with medical history significant for ongoing oncological workup for lytic lesions of the skull and vertebrae who presents with worsening deconditioning.  At this time I am deeply concerned about her weight loss, continued trouble with swallowing, and now dehydration hypercalcemia.  The patient only weighs 81 pounds and has a BMI of 14.82.  She is deconditioned incredibly badly and would not be able to tolerate treatment for malignancy if we did find it.  After discussion with the daughter they did wish to have a diagnosis prior to making decisions moving forward.  I think this is a reasonable approach and I would recommend performing a bone marrow biopsy in order to help rule out multiple myeloma.  If this is negative we could consider a CT chest abdomen pelvis for further evaluation.  We had initially planned to have a PET CT scan performed in the outpatient setting, however we are not able to do this while she is in the hospital.  Plan to address her hypercalcemia with Zometa and continued IV hydration.  Additionally her swallowing appears to be the biggest limiting factor in helping her to gain strength and therefore I recommend  consideration of a GI evaluation for dysphagia.  Overall I do believe this patient is hospice eligible and therefore would recommend early discussions with palliative care.  Once a diagnosis is reached we can discuss with the family what the expectations for her disease process will be.   #Lytic Lesions of the Spine #Hypercalcemia --unable to perform PET CT scan while patient is in house. --can proceed with bone marrow biopsy to help r/o diagnosis of multiple myeloma --agree with continued IV hydration for hypercalcemia --good renal function, can administer Zometa 4.0mg  IV x 1 dose while in house to help with hypercalcemia.  --unclear what underlying malignancy is, but most likely etiology given current labs is multiple myeloma --previously discussed with the family that she is not in condition to tolerate chemotherapy if we did find an underlying malignancy. They voiced their understanding, but would like to pursue a diagnosis --palliative care consult is recommended at this time. If family decided to pursue comfort measures only, that would be a reasonable approach.  --Oncology will continue to follow   #Poor PO Intake #Dysphagia --consider consult to GI for evaluation. Patient previously seen by SLP and ENT --do NOT recommend placement of PEG  tube. I recommend this procedure only as a bridge with temporary loss of swallowing function. It would not likely change the clinical course in this patient.    All questions were answered. The patient knows to call the clinic with any problems, questions or concerns.  A total of more than 50 minutes were spent on this encounter and over half of that time was spent on counseling and coordination of care as outlined above.   Ledell Peoples, MD Department of Hematology/Oncology [place] [facility] Phone: [phone] Pager: [phone] Email: [email or web address]  01/14/2020 10:43 AM

## 2020-01-14 NOTE — TOC Initial Note (Signed)
Transition of Care [facility]) - Initial/Assessment Note    Patient Details  Name: Hailey Moody MRN: [id number] Date of Birth: [birth year]  [facility] CM/SW Contact:    Lanier Clam, RN Phone Number: 01/14/2020, 11:43 AM  Clinical Narrative: Spoke to dtr Jackelyn Hoehn about d/c plans-patient with recent move from [place] to dtrs home in [place]-has rw,home 02(from [place])-informed dtr to contact provider of home 02-voiced understanding. CM will contact our provider here-[facility]-rep Ian Malkin will follow for new home 02 if qualifies & ordered. PT cons placed-await recc.                 Expected Discharge Plan: Home w Home Health Services Barriers to Discharge: Continued Medical Work up   Patient Goals and CMS Choice Patient states their goals for this hospitalization and ongoing recovery are:: go home CMS Medicare.gov Compare Post Acute Care list provided to:: Patient Represenative (must comment) Choice offered to / list presented to : Adult Children  Expected Discharge Plan and Services Expected Discharge Plan: Home w Home Health Services   Discharge Planning Services: CM Consult Post Acute Care Choice: Home Health Living arrangements for the past 2 months: Single Family Home                                      Prior Living Arrangements/Services Living arrangements for the past 2 months: Single Family Home Lives with:: Adult Children Patient language and need for interpreter reviewed:: Yes Do you feel safe going back to the place where you live?: Yes      Need for Family Participation in Patient Care: No (Comment) Care giver support system in place?: Yes (comment) Current home services: DME (home 02-unsure of provider,rw) Criminal Activity/Legal Involvement Pertinent to Current Situation/Hospitalization: No - Comment as needed  Activities of Daily Living Home Assistive Devices/Equipment: Walker (specify type), Raised toilet seat with rails, Shower chair  with back ADL Screening (condition at time of admission) Patient's cognitive ability adequate to safely complete daily activities?: No Is the patient deaf or have difficulty hearing?: Yes Does the patient have difficulty seeing, even when wearing glasses/contacts?: No Does the patient have difficulty concentrating, remembering, or making decisions?: Yes Patient able to express need for assistance with ADLs?: Yes Does the patient have difficulty dressing or bathing?: Yes Independently performs ADLs?: No Communication: Independent Dressing (OT): Needs assistance Is this a change from baseline?: Pre-admission baseline Grooming: Needs assistance Is this a change from baseline?: Pre-admission baseline Feeding: Independent Bathing: Needs assistance Is this a change from baseline?: Pre-admission baseline Toileting: Needs assistance Is this a change from baseline?: Pre-admission baseline In/Out Bed: Needs assistance Is this a change from baseline?: Pre-admission baseline Walks in Home: Independent with device (comment) Is this a change from baseline?: Pre-admission baseline Does the patient have difficulty walking or climbing stairs?: Yes Weakness of Legs: Both Weakness of Arms/Hands: Both  Permission Sought/Granted Permission sought to share information with : Case Manager Permission granted to share information with : Yes, Verbal Permission Granted  Share Information with NAME: Case Manager     Permission granted to share info w Relationship: [name] dtr [phone]     Emotional Assessment Appearance:: Appears stated age Attitude/Demeanor/Rapport: Gracious Affect (typically observed): Accepting Orientation: : Oriented to Self, Oriented to Place, Oriented to  Time Alcohol / Substance Use: Not Applicable Psych Involvement: No (comment)  Admission diagnosis:  Hypercalcemia [E83.52] Dehydration [place]ine.Bal.0]  Weakness [R53.1] Patient Active Problem List   Diagnosis Date Noted  .  Pressure injury of skin 01/14/2020  . Hypercalcemia 01/13/2020  . Failure to thrive in adult 01/13/2020  . Hyponatremia 01/13/2020  . Essential hypertension 01/13/2020  . Anxiety 01/13/2020  . COPD with chronic bronchitis (HCC) 01/13/2020  . Lytic bone lesions on xray 01/13/2020  . New daily persistent headache 11/06/2019  . Dysphagia 11/06/2019  . Cervical radiculopathy 11/06/2019  . Cervico-occipital neuralgia 11/06/2019  . Cervicalgia 11/06/2019  . Chronic neck pain 11/06/2019   PCP:  Deatra James, MD Pharmacy:   [facility] [phone], [place] - [address] [place] [address] Phone: [phone] Fax: [phone]     Social Determinants of Health (SDOH) Interventions    Readmission Risk Interventions No flowsheet data found.

## 2020-01-14 NOTE — Evaluation (Signed)
Clinical/Bedside Swallow Evaluation Patient Details  Name: Hailey Moody MRN: [id number] Date of Birth: 03-15-1946  Today's Date: 01/14/2020 Time: SLP Start Time (ACUTE ONLY): 1515 SLP Stop Time (ACUTE ONLY): 1600 SLP Time Calculation (min) (ACUTE ONLY): 45 min  Past Medical History:  Past Medical History:  Diagnosis Date  . Arthritis   . Borderline diabetes   . COPD (chronic obstructive pulmonary disease) (HCC)    on spiriva, nebulizer  . Essential hypertension   . Fatigue    unspecified  . Heart attack (HCC)    "mild"  . Hyperlipidemia   . Hypothyroidism   . Mini stroke (HCC)   . Vitamin D deficiency    Past Surgical History:  Past Surgical History:  Procedure Laterality Date  . temporal artery biopsy Left 10/02/2019   HPI:  74 yo female adm to [facility] with h/o COPD, HTN, HLD, arthritis, recently had a CT of her neck on 12/25/2019 showing a multifocal lytic osseous lesion at left skull base, C2 vertebrae and right scapula and left lymphadenopathy.  Pt reports progressive dysphagia over the last few months - to the point where she is only consuming [employer].  Pt reports everything is "burning" when she swallows.   Assessment / Plan / Recommendation Clinical Impression  Clinical swallow evaluation was abbreviated due to pt needing to participate in esophagram.  SLP reviewed prior MBS with pt and her daughter showing retention of barium but no aspiration or laryngeal penetration.  Pt accepted po of thin water via tsp, cup, straw and nectar thick water via tsp, cup.  Delayed swallow with wincing noted with pt reporting "burning" with swallowing.   Suspect delay in swallow is compensatory for pt to organize bolus due to her h/o progessive dysphagia.  Cough noted post-swallow of thin via large bore straw in Howe cup .   Small single boluses of thin and nectar via cup resulted in delayed swallow but no indication of aspiration.  This pt reports progressive dysphagia to foods more  than liquids and has self limited her intake resulting in decreased intake and weight loss.  Pt admits to po intake not providing comfort at this time.    SLP spoke to Dr Matthias Hughs, GI, and Dr Janee Morn, hospitalist,  regarding pt's prior swallowing function evaluation - MBS in 10/2019 and pt's report of progressive difficulties.  Note plan for esophagram today, recommend continue full liquids as pt tolerates - not using straws.  Reviewed recommendations for diet with pt and her daughter.  Anticipate pt's intake will be inadequate for nutrition due to her progressive dysphagia and questionable source.     SLP went with pt to xray for her esophagram and spoke to radiologist re: indication and concerns.  Will follow up. SLP Visit Diagnosis: Dysphagia, pharyngeal phase (R13.13)    Aspiration Risk  Moderate aspiration risk    Diet Recommendation Other (Comment) (full liquid diet)   Medication Administration: Other (Comment) (suspension) Supervision: Full supervision/cueing for compensatory strategies;Staff to assist with self feeding Compensations: Slow rate;Small sips/bites Postural Changes: Seated upright at 90 degrees;Remain upright for at least 30 minutes after po intake    Other  Recommendations Oral Care Recommendations: Oral care QID Other Recommendations: Have oral suction available   Follow up Recommendations None      Frequency and Duration min 1 x/week  1 week       Prognosis Prognosis for Safe Diet Advancement: Guarded      Swallow Study   General Date of Onset:  01/14/20 HPI: 74 yo female adm to [facility] with h/o COPD, HTN, HLD, arthritis, recently had a CT of her neck on 12/25/2019 showing a multifocal lytic osseous lesion at left skull base, C2 vertebrae and right scapula and left lymphadenopathy.  Pt reports progressive dysphagia over the last few months - to the point where she is only consuming [employer].  Pt reports everything is "burning" when she swallows. Type of Study:  Bedside Swallow Evaluation Previous Swallow Assessment: MBS 2021 August - dysphagia with decreased pharyngeal clearance and no aspiration Diet Prior to this Study: Thin liquids (full liquids) Temperature Spikes Noted: No Respiratory Status: Room air History of Recent Intubation: No Behavior/Cognition: Alert;Cooperative;Pleasant mood Oral Cavity Assessment: Within Functional Limits Oral Care Completed by SLP: No Oral Cavity - Dentition: Adequate natural dentition Vision: Functional for self-feeding Self-Feeding Abilities: Needs assist;Other (Comment) Patient Positioning: Upright in bed Baseline Vocal Quality: Suspected CN X (Vagus) involvement;Low vocal intensity;Hoarse (pt known to have left vocal fold paralysis) Volitional Cough: Weak Volitional Swallow: Unable to elicit    Oral/Motor/Sensory Function Overall Oral Motor/Sensory Function: Generalized oral weakness Facial ROM: Within Functional Limits Facial Symmetry: Abnormal symmetry right;Suspected CN VII (facial) dysfunction Facial Strength: Within Functional Limits Lingual ROM: Suspected CN XII (hypoglossal) dysfunction;Other (Comment) (generalized weakness) Lingual Strength: Other (Comment) (weakness) Velum:  (sluggish) Mandible: Other (Comment) (DNT)   Ice Chips Ice chips: Not tested   Thin Liquid Thin Liquid: Impaired Presentation: Cup;Spoon;Straw Oral Phase Impairments: Reduced lingual movement/coordination;Reduced labial seal Oral Phase Functional Implications: Oral holding Pharyngeal  Phase Impairments: Suspected delayed Swallow;Multiple swallows;Cough - Immediate;Cough - Delayed Other Comments: use of small single cup boluses tolerated better than with straw, suspect pt obtains too much liquid with use of large bore straw allowing aspiration, pt winces with swallowing    Nectar Thick Nectar Thick Liquid: Impaired Presentation: Cup;Self Fed;Spoon;Straw Oral Phase Impairments: Reduced labial seal;Reduced lingual  movement/coordination Oral phase functional implications: Oral holding Pharyngeal Phase Impairments: Suspected delayed Swallow   Honey Thick Honey Thick Liquid: Not tested   Puree Puree: Not tested   Solid     Solid: Not tested      Chales Abrahams 01/14/2020,5:18 PM  Rolena Infante, MS [facility] SLP Acute Rehab Services Office [phone] Pager [phone]

## 2020-01-14 NOTE — Progress Notes (Signed)
PROGRESS NOTE    Hailey Moody  QPY:[id number] DOB: 1945/09/15 DOA: 01/13/2020 PCP: Donald Prose, MD    Chief Complaint  Patient presents with  . Weakness  . Emesis    Brief Narrative:  HPI per Dr. Neysa Bonito This is a 74 year old female with a history of COPD, hypertension, hyperlipidemia, hypothyroidism, arthritis who presented to the ED with weakness and emesis which has been worsening over the past several days.  Recently had a CT scan of her neck on 12/25/2019 which revealed a multifocal lytic osseous lesion the left skull base, C2 vertebrae and right scapula and left lymphadenopathy and was referred to hematology/oncology and was seen by Dr. Lorenso Courier on 01/03/2020 who started a multiple myeloma work-up.  Currently has generalized weakness and dry mouth.  Also with fatigue and myalgias and constipation despite laxatives at home.  Reports difficulty with swallowing as well  ED Course: Afebrile, tachycardic and hypertensive on room air.  Notable labs: Sodium 124, chloride 85, BUN 24, creatinine 1.96, corrected calcium 13.1, WBC 11.2.  CXR unremarkable.  She received 1 L NS bolus.  ED physician discussed with oncology who will see tomorrow.  Assessment & Plan:   Principal Problem:   Failure to thrive in adult Active Problems:   Hypercalcemia   Hyponatremia   Essential hypertension   Anxiety   COPD with chronic bronchitis (HCC)   Lytic bone lesions on xray   Pressure injury of skin  #1 failure to thrive Multifactorial secondary to hypercalcemia of malignancy, hyponatremia, dysphagia leading to poor oral intake.  Replete electrolytes.  Placed on laxative/enema for constipation.  Oncology consulted.  Palliative care consultation for goals of care.  IV fluids.  Supportive care.  2.  Hypercalcemia likely secondary to malignancy Patient being worked up by oncology, for lytic bone lesions noted on recent CT.  Oncology recommending bone marrow biopsy to help rule out diagnosis of multiple  myeloma.  Continue IV hydration.  Zometa 4 mg IV x1.  Supportive care.  Oncology following.  3.  Lytic lesions of the spine Being followed by hematology/oncology.  Oncology consulted and following the patient during this hospitalization.  Bone marrow biopsy ordered to help rule out multiple myeloma.  PET scan/CT scan in the outpatient setting.  Per oncology.  4.  Dysphagia Patient with complaints of dysphagia.  Patient noted to have seen speech therapy and ENT in the past.  Speech therapy evaluation pending.  Oncology recommending evaluation per GI.  Spoke with Dr. Cristina Gong of gastroenterology who recommended a barium esophagram for initial evaluation.  Dr. Cristina Gong will follow up on barium esophagram results and if abnormal will formally consult on the patient.  Continue full liquid diet.  Oncology does NOT recommend placement of PEG tube at this time.  Supportive care.  5.  Hyponatremia Secondary to hypovolemic hyponatremia.  Sodium is 124 on admission.  Patient placed on IV fluids and sodium slowly improving currently at 126.  Continue IV fluids.  Supportive care.  6.  Hypokalemia/hypomagnesemia/hypophosphatemia Potassium packet 40 mEq p.o. every 4 hours x2 doses.  Magnesium sulfate 4 g IV x1.  Potassium phosphate 30 mmol IV x1.  Repeat labs in the morning.  7.  Anemia Check an anemia panel.  Follow H&H.  Transfusion threshold hemoglobin < 7.  8.  Hypothyroidism TSH noted at 20.906 (01/03/2020).  Patient's daughter does state patient had discussed it with the PCP and generic Synthroid was recently changed to name brand Synthroid and decision at that time was to repeat thyroid  function studies or follow-up with PCP and if still elevated subsequently to increase Synthroid.  Will defer adjustment of Synthroid to PCP.  Outpatient follow-up.  9.  COPD Stable.  Continue Spiriva albuterol.    DVT prophylaxis: Lovenox Code Status: DNR Family Communication: Updated patient and daughter at  bedside. Disposition:   Status is: Inpatient    Dispo: The patient is from: Home              Anticipated d/c is to: To be determined              Anticipated d/c date is: 4 to 5 days              Patient currently admitted for failure to thrive, generalized weakness, noted to be hypercalcemic.  Not stable for discharge.       Consultants:   Oncology: Dr. Lorenso Courier pending  Procedures:   Chest x-ray 01/13/2020  Barium esophagram pending 01/14/2020  Antimicrobials:   None   Subjective: Asleep but arousable.  Patient with complaints of ongoing generalized weakness.  Complains of some dysphagia feeling of food getting stuck down in her throat which cannot go further down leading to poor oral intake over the past several months.  Denies chest pain.  Denies shortness of breath.  Daughter at bedside.  Objective: Vitals:   01/14/20 0543 01/14/20 0800 01/14/20 0918 01/14/20 1029  BP: (!) 141/82  140/80 133/61  Pulse: 83  80 71  Resp: 16     Temp: 98.6 F (37 C)   98 F (36.7 C)  TempSrc: Oral   Oral  SpO2: 94% 95%  97%  Weight:      Height:        Intake/Output Summary (Last 24 hours) at 01/14/2020 1225 Last data filed at 01/13/2020 1353 Gross per 24 hour  Intake 1000 ml  Output --  Net 1000 ml   Filed Weights   01/13/20 1124  Weight: 36.7 kg    Examination:  General exam: Frail.  Cachectic. Respiratory system: Clear to auscultation. Respiratory effort normal. Cardiovascular system: S1 & S2 heard, RRR. No JVD, murmurs, rubs, gallops or clicks. No pedal edema. Gastrointestinal system: Abdomen is nondistended, soft and nontender. No organomegaly or masses felt. Normal bowel sounds heard. Central nervous system: Alert and oriented. No focal neurological deficits. Extremities: Symmetric 5 x 5 power. Skin: No rashes, lesions or ulcers Psychiatry: Judgement and insight appear normal. Mood & affect appropriate.     Data Reviewed: I have personally reviewed  following labs and imaging studies  CBC: Recent Labs  Lab 01/13/20 1201 01/14/20 0502  WBC 11.2* 9.4  NEUTROABS 9.8*  --   HGB 12.3 10.0*  HCT 37.5 29.2*  MCV 83.5 82.0  PLT 572* 496*    Basic Metabolic Panel: Recent Labs  Lab 01/13/20 1201 01/14/20 0859  NA 124* 126*  K 3.8 3.1*  CL 85* 90*  CO2 26 24  GLUCOSE 95 84  BUN 24* 20  CREATININE 0.96 0.80  CALCIUM 12.5* 10.5*  MG  --  1.3*  PHOS  --  2.4*    GFR: Estimated Creatinine Clearance: 35.2 mL/min (by C-G formula based on SCr of 0.8 mg/dL).  Liver Function Tests: Recent Labs  Lab 01/13/20 1201 01/14/20 0859  AST 19 16  ALT 13 13  ALKPHOS 100 82  BILITOT 0.7 1.0  PROT 7.8 6.4*  ALBUMIN 3.2* 2.6*    CBG: Recent Labs  Lab 01/14/20 0738 01/14/20 1113  GLUCAP 81  84     Recent Results (from the past 240 hour(s))  Respiratory Panel by RT PCR (Flu A&B, Covid) - Nasopharyngeal Swab     Status: None   Collection Time: 01/13/20  3:57 PM   Specimen: Nasopharyngeal Swab  Result Value Ref Range Status   SARS Coronavirus 2 by RT PCR NEGATIVE NEGATIVE Final    Comment: (NOTE) SARS-CoV-2 target nucleic acids are NOT DETECTED.  The SARS-CoV-2 RNA is generally detectable in upper respiratoy specimens during the acute phase of infection. The lowest concentration of SARS-CoV-2 viral copies this assay can detect is 131 copies/mL. A negative result does not preclude SARS-Cov-2 infection and should not be used as the sole basis for treatment or other patient management decisions. A negative result may occur with  improper specimen collection/handling, submission of specimen other than nasopharyngeal swab, presence of viral mutation(s) within the areas targeted by this assay, and inadequate number of viral copies (<131 copies/mL). A negative result must be combined with clinical observations, patient history, and epidemiological information. The expected result is Negative.  Fact Sheet for Patients:   PinkCheek.be  Fact Sheet for Healthcare Providers:  GravelBags.it  This test is no t yet approved or cleared by the [place] FDA and  has been authorized for detection and/or diagnosis of SARS-CoV-2 by FDA under an Emergency Use Authorization (EUA). This EUA will remain  in effect (meaning this test can be used) for the duration of the COVID-19 declaration under Section 564(b)(1) of the Act, 21 U.S.C. section 360bbb-3(b)(1), unless the authorization is terminated or revoked sooner.     Influenza A by PCR NEGATIVE NEGATIVE Final   Influenza B by PCR NEGATIVE NEGATIVE Final    Comment: (NOTE) The Xpert Xpress SARS-CoV-2/FLU/RSV assay is intended as an aid in  the diagnosis of influenza from Nasopharyngeal swab specimens and  should not be used as a sole basis for treatment. Nasal washings and  aspirates are unacceptable for Xpert Xpress SARS-CoV-2/FLU/RSV  testing.  Fact Sheet for Patients: PinkCheek.be  Fact Sheet for Healthcare Providers: GravelBags.it  This test is not yet approved or cleared by the [place] FDA and  has been authorized for detection and/or diagnosis of SARS-CoV-2 by  FDA under an Emergency Use Authorization (EUA). This EUA will remain  in effect (meaning this test can be used) for the duration of the  Covid-19 declaration under Section 564(b)(1) of the Act, 21  U.S.C. section 360bbb-3(b)(1), unless the authorization is  terminated or revoked. Performed at [facility], [place] [address], [place], [place] [address]          Radiology Studies: [facility] Chest Port 1 View  Result Date: 01/13/2020 CLINICAL DATA:  Weakness. EXAM: PORTABLE CHEST 1 VIEW COMPARISON:  None. FINDINGS: The heart size and mediastinal contours are within normal limits. Both lungs are clear. The visualized skeletal structures are unremarkable.  IMPRESSION: No active disease. Electronically Signed   By: Marijo Conception M.D.   On: 01/13/2020 11:58        Scheduled Meds: . amLODipine  5 mg Oral Daily  . docusate sodium  100 mg Oral BID  . enoxaparin (LOVENOX) injection  30 mg Subcutaneous Q24H  . feeding supplement  237 mL Oral BID BM  . levothyroxine  125 mcg Oral Daily  . metoprolol tartrate  25 mg Oral Daily  . potassium chloride  40 mEq Oral Q4H  . sertraline  100 mg Oral Daily  . sodium chloride flush  3 mL  Intravenous Q12H  . umeclidinium bromide  1 puff Inhalation Daily   Continuous Infusions: . sodium chloride 125 mL/hr at 01/14/20 0919  . magnesium sulfate bolus IVPB    . potassium PHOSPHATE IVPB (in mmol)    . sodium chloride       LOS: 1 day    Time spent: 40 minutes    Irine Seal, MD [facility]   To contact the attending provider between 7A-7P or the covering provider during after hours 7P-7A, please log into the web site [email or web address] and access using universal [place] password for that web site. If you do not have the password, please call the hospital operator.  01/14/2020, 12:25 PM

## 2020-01-15 ENCOUNTER — Other Ambulatory Visit: Payer: Self-pay

## 2020-01-15 ENCOUNTER — Encounter: Payer: Medicare PPO | Admitting: Physical Therapy

## 2020-01-15 ENCOUNTER — Inpatient Hospital Stay (HOSPITAL_COMMUNITY): Payer: Medicare PPO

## 2020-01-15 ENCOUNTER — Encounter (HOSPITAL_COMMUNITY): Payer: Self-pay | Admitting: Internal Medicine

## 2020-01-15 DIAGNOSIS — E860 Dehydration: Secondary | ICD-10-CM | POA: Diagnosis not present

## 2020-01-15 DIAGNOSIS — R627 Adult failure to thrive: Secondary | ICD-10-CM | POA: Diagnosis not present

## 2020-01-15 DIAGNOSIS — R1310 Dysphagia, unspecified: Secondary | ICD-10-CM | POA: Diagnosis not present

## 2020-01-15 LAB — RENAL FUNCTION PANEL
Albumin: 2.3 g/dL — ABNORMAL LOW (ref 3.5–5.0)
Anion gap: 11 (ref 5–15)
BUN: 15 mg/dL (ref 8–23)
CO2: 21 mmol/L — ABNORMAL LOW (ref 22–32)
Calcium: 9.7 mg/dL (ref 8.9–10.3)
Chloride: 96 mmol/L — ABNORMAL LOW (ref 98–111)
Creatinine, Ser: 0.75 mg/dL (ref 0.44–1.00)
GFR, Estimated: >60 mL/min (ref >60)
Glucose, Bld: 73 mg/dL (ref 70–99)
Phosphorus: 2.6 mg/dL (ref 2.5–4.6)
Potassium: 4.6 mmol/L (ref 3.5–5.1)
Sodium: 128 mmol/L — ABNORMAL LOW (ref 135–145)

## 2020-01-15 LAB — CBC WITH DIFFERENTIAL/PLATELET
Abs Immature Granulocytes: 0.03 10*3/uL (ref 0.00–0.07)
Basophils Absolute: 0 10*3/uL (ref 0.0–0.1)
Basophils Relative: 0 %
Eosinophils Absolute: 0 10*3/uL (ref 0.0–0.5)
Eosinophils Relative: 0 %
HCT: 30.1 % — ABNORMAL LOW (ref 36.0–46.0)
Hemoglobin: 10.1 g/dL — ABNORMAL LOW (ref 12.0–15.0)
Immature Granulocytes: 0 %
Lymphocytes Relative: 5 %
Lymphs Abs: 0.6 10*3/uL — ABNORMAL LOW (ref 0.7–4.0)
MCH: 28.4 pg (ref 26.0–34.0)
MCHC: 33.6 g/dL (ref 30.0–36.0)
MCV: 84.6 fL (ref 80.0–100.0)
Monocytes Absolute: 1.1 10*3/uL — ABNORMAL HIGH (ref 0.1–1.0)
Monocytes Relative: 11 %
Neutro Abs: 8.7 10*3/uL — ABNORMAL HIGH (ref 1.7–7.7)
Neutrophils Relative %: 84 %
Platelets: 460 10*3/uL — ABNORMAL HIGH (ref 150–400)
RBC: 3.56 MIL/uL — ABNORMAL LOW (ref 3.87–5.11)
RDW: 13.5 % (ref 11.5–15.5)
WBC: 10.4 10*3/uL (ref 4.0–10.5)
nRBC: 0 % (ref 0.0–0.2)

## 2020-01-15 LAB — PTH, INTACT AND CALCIUM
Calcium, Total (PTH): 11.9 mg/dL — ABNORMAL HIGH (ref 8.7–10.3)
PTH: 7 pg/mL — ABNORMAL LOW (ref 15–65)

## 2020-01-15 LAB — MAGNESIUM: Magnesium: 2.1 mg/dL (ref 1.7–2.4)

## 2020-01-15 LAB — PROTIME-INR
INR: 1.2 (ref 0.8–1.2)
Prothrombin Time: 14.8 seconds (ref 11.4–15.2)

## 2020-01-15 MED ORDER — NALOXONE HCL 0.4 MG/ML IJ SOLN
INTRAMUSCULAR | Status: AC
  Filled 2020-01-15: qty 1

## 2020-01-15 MED ORDER — ADULT MULTIVITAMIN W/MINERALS CH: 1.0000 | ORAL_TABLET | Freq: Every day | ORAL | Status: DC

## 2020-01-15 MED ORDER — FLUMAZENIL 0.5 MG/5ML IV SOLN
INTRAVENOUS | Status: AC
  Filled 2020-01-15: qty 5

## 2020-01-15 MED ORDER — FENTANYL CITRATE (PF) 100 MCG/2ML IJ SOLN
INTRAMUSCULAR | Status: AC
  Filled 2020-01-15: qty 2

## 2020-01-15 MED ORDER — MIDAZOLAM HCL 2 MG/2ML IJ SOLN
INTRAMUSCULAR | Status: AC
  Filled 2020-01-15: qty 2

## 2020-01-15 MED ORDER — LIDOCAINE HCL (PF) 1 % IJ SOLN
INTRAMUSCULAR | Status: AC | PRN
  Administered 2020-01-15: 10 mL via INTRADERMAL

## 2020-01-15 MED ORDER — MIDAZOLAM HCL 2 MG/2ML IJ SOLN
INTRAMUSCULAR | Status: AC | PRN
  Administered 2020-01-15 (×3): .25 mg via INTRAVENOUS

## 2020-01-15 MED ORDER — ADULT MULTIVITAMIN LIQUID CH
15.0000 mL | Freq: Every day | ORAL | Status: DC
  Administered 2020-01-15 – 2020-01-16 (×2): 15 mL via ORAL
  Filled 2020-01-15 (×3): qty 15

## 2020-01-15 MED ORDER — FENTANYL CITRATE (PF) 100 MCG/2ML IJ SOLN
INTRAMUSCULAR | Status: AC | PRN
Start: 2020-01-15 — End: 2020-01-15
  Administered 2020-01-15 (×3): 12.5 ug via INTRAVENOUS

## 2020-01-15 NOTE — Evaluation (Signed)
Physical Therapy Evaluation Patient Details Name: Hailey Moody MRN: [id number] DOB: 1945/10/24 Today's Date: 01/15/2020   History of Present Illness  Pt is 74 yo female with a history of COPD, hypertension, hyperlipidemia, hypothyroidism, arthritis who presented to the ED with weakness and emesis which has been worsening over the past several days.  Pt recently found to have multifocal lytic osseous lesions left skull base, C2 vertebrae and right scapula and left lymphadenopathy. Pt admitted with FTT, hypercalcemia, and hyponatremia.  Clinical Impression  Pt admitted with above diagnosis. Pt currently presenting with decreased mobility, strength, endurance, ROM, balance, and safety.  She required min A for transfers and to ambulate short distance, with increased time and cues.  Pt was unsteady requiring min A for balance.  Family reports rapid decline in function over the past 1-2 months.  Pt does have 24 hr care at this time but son reports this is becoming difficult (pt stays with her daughter), and that he and his sister need to discuss plans going forward.  PT recommending SNF vs 24 hr care and HHPT , pending family goals/capabilities.  Pt currently with functional limitations due to the deficits listed below (see PT Problem List). Pt will benefit from skilled PT to increase their independence and safety with mobility to allow discharge to the venue listed below.       Follow Up Recommendations SNF;Supervision/Assistance - 24 hour (SNF vs home 24 hr supervision and HHPT (pending family goals))    Equipment Recommendations  None recommended by PT    Recommendations for Other Services       Precautions / Restrictions Precautions Precautions: Fall      Mobility  Bed Mobility Overal bed mobility: Needs Assistance Bed Mobility: Supine to Sit     Supine to sit: Min assist;HOB elevated          Transfers Overall transfer level: Needs assistance Equipment used: Rolling walker (2  wheeled) Transfers: Sit to/from [employer] Sit to Stand: Min assist Stand pivot transfers: Min assist       General transfer comment: sit to stand x 3 during session with cues for hand placement; stand pivot for bed to bsc with assist for toileting ADLs  Ambulation/Gait Ambulation/Gait assistance: Min assist Gait Distance (Feet): 20 Feet Assistive device: Rolling walker (2 wheeled) Gait Pattern/deviations: Step-to pattern;Decreased stride length;Trunk flexed;Shuffle Gait velocity: decreased   General Gait Details: cues for RW and posture  Stairs            Wheelchair Mobility    Modified Rankin (Stroke Patients Only)       Balance Overall balance assessment: Needs assistance Sitting-balance support: No upper extremity supported Sitting balance-Leahy Scale: Fair     Standing balance support: Bilateral upper extremity supported Standing balance-Leahy Scale: Poor Standing balance comment: required RW and min A at times                             Pertinent Vitals/Pain Pain Assessment: Faces Faces Pain Scale: Hurts a little bit Pain Location: back and shoulder Pain Descriptors / Indicators: Aching;Discomfort Pain Intervention(s): Limited activity within patient's tolerance;Monitored during session;Premedicated before session    Home Living Family/patient expects to be discharged to:: Unsure Living Arrangements: Children (daughter) Available Help at Discharge: Family;Other (Comment);Available 24 hours/day Type of Home: House Home Access: Stairs to enter Entrance Stairs-Rails: None Entrance Stairs-Number of Steps: 2 Home Layout: Two level;Able to live on main level with bedroom/bathroom  Home Equipment: Walker - 2 wheels;Transport chair;Shower seat Additional Comments: elevated toilet seat    Prior Function Level of Independence: Needs assistance   Gait / Transfers Assistance Needed: Could walk in the house some with RW - recently  needing a little help  ADL's / Homemaking Assistance Needed: Daughter assisting with dressing and bathing but pt did toileting        Hand Dominance        Extremity/Trunk Assessment   Upper Extremity Assessment Upper Extremity Assessment: Generalized weakness    Lower Extremity Assessment Lower Extremity Assessment: LLE deficits/detail;RLE deficits/detail RLE Deficits / Details: ROM WFL; MMT 4/5 LLE Deficits / Details: ROM WFL; MMT 4/5    Cervical / Trunk Assessment Cervical / Trunk Assessment: Kyphotic  Communication   Communication: Other (comment) (soft spoken)  Cognition Arousal/Alertness: Awake/alert Behavior During Therapy: WFL for tasks assessed/performed Overall Cognitive Status: Impaired/Different from baseline Area of Impairment: Orientation;Memory;Problem solving                 Orientation Level: Disoriented to;Time;Situation   Memory: Decreased short-term memory       Problem Solving: Slow processing;Requires verbal cues;Requires tactile cues        General Comments General comments (skin integrity, edema, etc.): Fatigued easily requiring rest breaks.  VSS on RA.  Son was present.  Discussed PT role and POC.  Son reports that function at home has been declining quickly and it is becoming difficult at times.  Pt was going to outpt PT.  Son reports he just got and town and needs to discuss options with sister.  We discussed SNF vs home with 24 hr care and HHPT pending family wishes.    Exercises     Assessment/Plan    PT Assessment Patient needs continued PT services  PT Problem List Decreased strength;Decreased mobility;Decreased safety awareness;Decreased activity tolerance;Decreased balance;Decreased knowledge of use of DME;Decreased cognition;Pain;Decreased range of motion       PT Treatment Interventions DME instruction;Therapeutic activities;Gait training;Therapeutic exercise;Patient/family education;Stair training;Balance  training;Functional mobility training    PT Goals (Current goals can be found in the Care Plan section)  Acute Rehab PT Goals Patient Stated Goal: not stated; son will discuss with sister to discuss goals for discharge location PT Goal Formulation: With patient/family Time For Goal Achievement: 01/29/20 Potential to Achieve Goals: Fair    Frequency Min 3X/week   Barriers to discharge        Co-evaluation               AM-PAC PT "6 Clicks" Mobility  Outcome Measure Help needed turning from your back to your side while in a flat bed without using bedrails?: A Little Help needed moving from lying on your back to sitting on the side of a flat bed without using bedrails?: A Little Help needed moving to and from a bed to a chair (including a wheelchair)?: A Little Help needed standing up from a chair using your arms (e.g., wheelchair or bedside chair)?: A Little Help needed to walk in hospital room?: A Little Help needed climbing 3-5 steps with a railing? : A Lot 6 Click Score: 17    End of Session Equipment Utilized During Treatment: Gait belt Activity Tolerance: Patient tolerated treatment well;No increased pain Patient left: with chair alarm set;in chair;with call bell/phone within reach;with family/visitor present Nurse Communication: Mobility status PT Visit Diagnosis: Unsteadiness on feet (R26.81);Muscle weakness (generalized) (M62.81)    Time: 7628-3151 PT Time Calculation (min) (ACUTE ONLY): 30 min  Charges:   PT Evaluation $PT Eval Moderate Complexity: 1 Mod PT Treatments $Therapeutic Activity: 8-22 mins        Anise Salvo, PT Acute Rehab Services Pager [phone] [facility] [phone]    Rayetta Humphrey 01/15/2020, 2:06 PM

## 2020-01-15 NOTE — Progress Notes (Signed)
Pt returned post bone marrow aspiration. Dressing intact to lower back area. VSS Pt alert

## 2020-01-15 NOTE — Progress Notes (Addendum)
PROGRESS NOTE   Hailey Moody  OJJ:[id number]    DOB: [birth year]    DOA: 01/13/2020  PCP: Donald Prose, MD   I have briefly reviewed patients previous medical records in [facility].  Chief Complaint  Patient presents with  . Weakness  . Emesis    Brief Narrative:  74 year old female with PMH of COPD, hypertension, hyperlipidemia, hypothyroidism, arthritis presented to the ED with complaints of weakness and emesis which were worsening over several days.  Recently had a CT scan of her neck on 12/25/2019 which revealed a multifocal lytic osseous lesion in the left skull base, C2 vertebrae and right scapula and left lymphadenopathy.  She was referred to hematology/oncology, seen by Dr. Lorenso Courier on 01/03/2020 and multiple myeloma work-up was initiated.  Admitted for dehydration with hyponatremia, hypercalcemia, suspected metastatic malignancy.  Medical oncology consulted.  S/p bone biopsy by IR on 10/20.   Assessment & Plan:  Principal Problem:   Failure to thrive in adult Active Problems:   Hypercalcemia   Hyponatremia   Essential hypertension   Anxiety   COPD with chronic bronchitis (HCC)   Lytic bone lesions on xray   Pressure injury of skin   Dehydration   Weakness   Hypercalcemia/skeletal lytic lesions, likely secondary to malignancy.  Skeletal lytic lesions and lymphadenopathy noted on recent CT.  Oncology input appreciated.  They have discussed in detail with patient's daughter.  Due to current severe deconditioning, patient may not be able to tolerate treatment for malignancy if confirmed.  Family however wished to have a diagnosis prior to making further decisions.  S/p bone marrow biopsy by IR on 10/20.  Ruling out multiple myeloma.  Follow pathology results.  PET/CT as outpatient.  Continue IV hydration for hypercalcemia, s/p Zometa 4 mg IV x1 dose on 10/19.  As per oncology, if bone marrow biopsy is negative then consider CT chest abdomen and pelvis for further  evaluation.  Also as per oncology recommendation, patient is hospice eligible and will consult palliative care.  Uncorrected calcium down to 9.7, improving.  Follow BMP daily  Dehydration with hyponatremia  Secondary to poor oral intake and hypercalcemia  Continue IV fluids.  Hydration appears borderline and not volume overloaded at this time.  Sodium is improved from 124 on admission to 128.  Dysphagia  GI reviewed patient's barium swallow which did not show any esophageal stricture or significant esophageal dysmotility.  As per ST recommendations, thin liquids, nectar thick liquids (full liquids).  However this may not be adequate to build up her nutritional status enough for cancer treatment unless intake drastically improves.  Will await palliative care input regarding pursuing PEG tube etc. if warranted.  However oncology clearly do not recommend placement of PEG tube unless this is only a bridge with temporary loss of swallowing function.  They indicated that this is not likely to change the clinical course in this patient.  Hypokalemia/hypomagnesemia/hypophosphatemia  Replaced.  Periodically follow.  Anemia:  Likely related to malignancy/chronic disease.  Stable.  Hypothyroidism  TSH 20.906 on 01/03/2020.  Patient's daughter had reported to previous hospitalist that patient had discussed it with her PCP and generic Synthroid was recently changed to name brand Synthroid and decision at that time was to repeat TFTs or follow-up with PCP and if still elevated subsequently to increase Synthroid.  Defer to outpatient follow-up with PCP.  COPD  Stable.  Continue Spiriva/albuterol.  Essential hypertension  Controlled on amlodipine and metoprolol.  Body mass index is 14.82 kg/m./Severe malnutrition  Consulted dietitian.  Severe deconditioning  PT evaluation when able.  Adult failure to thrive  Multifactorial but most likely due to metastatic malignancy  complicating advanced age.  DVT prophylaxis: enoxaparin (LOVENOX) injection 30 mg Start: 01/15/20 2200     Code Status: DNR Family Communication: None at bedside. Disposition:  Status is: Inpatient  Remains inpatient appropriate because:Inpatient level of care appropriate due to severity of illness   Dispo: The patient is from: Home              Anticipated d/c is to: TBD.              Anticipated d/c date is: > 3 days              Patient currently is not medically stable to d/c.        Consultants:   Medical oncology Interventional radiology Palliative care medicine  Procedures:   Bone marrow biopsy by IR 10/20.  Antimicrobials:    Anti-infectives (From admission, onward)   None        Subjective:  Patient alert, oriented to self, place but not to time.  Appears somewhat confused.  As per RN at bedside, more confused than yesterday.  Reports back pain but denies any other complaints.  Objective:   Vitals:   01/15/20 1030 01/15/20 1046 01/15/20 1130 01/15/20 1200  BP: 127/62 128/64 116/63 122/64  Pulse: 82 80    Resp: _0 Temp: 98.6 F (37 C) 98.3 F (36.8 C) 97.9 F (36.6 C) 97.9 F (36.6 C)  TempSrc: Oral Oral Oral Oral  SpO2: 96% 98% 96% 95%  Weight:      Height:        General exam: Elderly female, small built, frail, chronically ill looking lying comfortably propped up in bed.  Oral mucosa dry. Respiratory system: Poor inspiratory effort but clear to auscultation. Respiratory effort normal. Cardiovascular system: S1 & S2 heard, RRR. No JVD, murmurs, rubs, gallops or clicks. No pedal edema.  Telemetry personally reviewed: Sinus rhythm. Gastrointestinal system: Abdomen is nondistended, soft and nontender. No organomegaly or masses felt. Normal bowel sounds heard. Central nervous system: Alert and oriented x2. No focal neurological deficits. Extremities: Symmetric 5 x 5 power. Skin: No rashes, lesions or ulcers Psychiatry: Judgement and  insight impaired. Mood & affect flat.     Data Reviewed:   I have personally reviewed following labs and imaging studies   CBC: Recent Labs  Lab 01/13/20 1201 01/14/20 0502 01/15/20 0508  WBC 11.2* 9.4 10.4  NEUTROABS 9.8*  --  8.7*  HGB 12.3 10.0* 10.1*  HCT 37.5 29.2* 30.1*  MCV 83.5 82.0 84.6  PLT 572* 496* 460*    Basic Metabolic Panel: Recent Labs  Lab 01/13/20 1201 01/13/20 2141 01/14/20 0859 01/15/20 0508  NA 124*  --  126* 128*  K 3.8  --  3.1* 4.6  CL 85*  --  90* 96*  CO2 26  --  24 21*  GLUCOSE 95  --  84 73  BUN 24*  --  20 15  CREATININE 0.96  --  0.80 0.75  CALCIUM 12.5* 11.9* 10.5* 9.7  MG  --   --  1.3* 2.1  PHOS  --   --  2.4* 2.6    Liver Function Tests: Recent Labs  Lab 01/13/20 1201 01/14/20 0859 01/15/20 0508  AST 19 16  --   ALT 13 13  --   ALKPHOS 100 82  --  BILITOT 0.7 1.0  --   PROT 7.8 6.4*  --   ALBUMIN 3.2* 2.6* 2.3*    CBG: Recent Labs  Lab 01/14/20 0738 01/14/20 1113  GLUCAP 81 84    Microbiology Studies:   Recent Results (from the past 240 hour(s))  Respiratory Panel by RT PCR (Flu A&B, Covid) - Nasopharyngeal Swab     Status: None   Collection Time: 01/13/20  3:57 PM   Specimen: Nasopharyngeal Swab  Result Value Ref Range Status   SARS Coronavirus 2 by RT PCR NEGATIVE NEGATIVE Final    Comment: (NOTE) SARS-CoV-2 target nucleic acids are NOT DETECTED.  The SARS-CoV-2 RNA is generally detectable in upper respiratoy specimens during the acute phase of infection. The lowest concentration of SARS-CoV-2 viral copies this assay can detect is 131 copies/mL. A negative result does not preclude SARS-Cov-2 infection and should not be used as the sole basis for treatment or other patient management decisions. A negative result may occur with  improper specimen collection/handling, submission of specimen other than nasopharyngeal swab, presence of viral mutation(s) within the areas targeted by this assay, and  inadequate number of viral copies (<131 copies/mL). A negative result must be combined with clinical observations, patient history, and epidemiological information. The expected result is Negative.  Fact Sheet for Patients:  PinkCheek.be  Fact Sheet for Healthcare Providers:  GravelBags.it  This test is no t yet approved or cleared by the [place] FDA and  has been authorized for detection and/or diagnosis of SARS-CoV-2 by FDA under an Emergency Use Authorization (EUA). This EUA will remain  in effect (meaning this test can be used) for the duration of the COVID-19 declaration under Section 564(b)(1) of the Act, 21 U.S.C. section 360bbb-3(b)(1), unless the authorization is terminated or revoked sooner.     Influenza A by PCR NEGATIVE NEGATIVE Final   Influenza B by PCR NEGATIVE NEGATIVE Final    Comment: (NOTE) The Xpert Xpress SARS-CoV-2/FLU/RSV assay is intended as an aid in  the diagnosis of influenza from Nasopharyngeal swab specimens and  should not be used as a sole basis for treatment. Nasal washings and  aspirates are unacceptable for Xpert Xpress SARS-CoV-2/FLU/RSV  testing.  Fact Sheet for Patients: PinkCheek.be  Fact Sheet for Healthcare Providers: GravelBags.it  This test is not yet approved or cleared by the [place] FDA and  has been authorized for detection and/or diagnosis of SARS-CoV-2 by  FDA under an Emergency Use Authorization (EUA). This EUA will remain  in effect (meaning this test can be used) for the duration of the  Covid-19 declaration under Section 564(b)(1) of the Act, 21  U.S.C. section 360bbb-3(b)(1), unless the authorization is  terminated or revoked. Performed at [facility], [place] [address], [place], [place] [address]      Radiology Studies:  CT BIOPSY  Result Date: 01/15/2020 CLINICAL DATA:   Lytic bone lesions, hypercalcemia and abnormal protein electrophoresis. Suspected multiple myeloma. Bone marrow biopsy required for further evaluation. EXAM: CT GUIDED BONE MARROW ASPIRATION AND BIOPSY ANESTHESIA/SEDATION: Versed 0.75 mg IV, Fentanyl 37.5 mcg IV Total Moderate Sedation Time:   16 minutes. The patient's level of consciousness and physiologic status were continuously monitored during the procedure by Radiology nursing. PROCEDURE: The procedure risks, benefits, and alternatives were explained to the patient. Questions regarding the procedure were encouraged and answered. The patient understands and consents to the procedure. A time out was performed prior to initiating the procedure. The right gluteal region was prepped with chlorhexidine.  Sterile gown and sterile gloves were used for the procedure. Local anesthesia was provided with 1% Lidocaine. Under CT guidance, an 11 gauge On Control bone cutting needle was advanced from a posterior approach into the right iliac bone. Needle positioning was confirmed with CT. Initial non heparinized and heparinized aspirate samples were obtained of bone marrow. Core biopsy was performed via the On Control drill needle. COMPLICATIONS: None FINDINGS: Inspection of initial aspirate did reveal visible particles. Intact core biopsy sample was obtained. IMPRESSION: CT guided bone marrow biopsy of right posterior iliac bone with both aspirate and core samples obtained. Electronically Signed   By: Aletta Edouard M.D.   On: 01/15/2020 10:50   CT BONE MARROW BIOPSY & ASPIRATION  Result Date: 01/15/2020 CLINICAL DATA:  Lytic bone lesions, hypercalcemia and abnormal protein electrophoresis. Suspected multiple myeloma. Bone marrow biopsy required for further evaluation. EXAM: CT GUIDED BONE MARROW ASPIRATION AND BIOPSY ANESTHESIA/SEDATION: Versed 0.75 mg IV, Fentanyl 37.5 mcg IV Total Moderate Sedation Time:   16 minutes. The patient's level of consciousness and  physiologic status were continuously monitored during the procedure by Radiology nursing. PROCEDURE: The procedure risks, benefits, and alternatives were explained to the patient. Questions regarding the procedure were encouraged and answered. The patient understands and consents to the procedure. A time out was performed prior to initiating the procedure. The right gluteal region was prepped with chlorhexidine. Sterile gown and sterile gloves were used for the procedure. Local anesthesia was provided with 1% Lidocaine. Under CT guidance, an 11 gauge On Control bone cutting needle was advanced from a posterior approach into the right iliac bone. Needle positioning was confirmed with CT. Initial non heparinized and heparinized aspirate samples were obtained of bone marrow. Core biopsy was performed via the On Control drill needle. COMPLICATIONS: None FINDINGS: Inspection of initial aspirate did reveal visible particles. Intact core biopsy sample was obtained. IMPRESSION: CT guided bone marrow biopsy of right posterior iliac bone with both aspirate and core samples obtained. Electronically Signed   By: Aletta Edouard M.D.   On: 01/15/2020 10:50   DG ESOPHAGUS W SINGLE CM (SOL OR THIN BA)  Result Date: 01/14/2020 CLINICAL DATA:  Dysphagia EXAM: ESOPHOGRAM/BARIUM SWALLOW TECHNIQUE: Single contrast examination was performed using thin barium. FLUOROSCOPY TIME:  Fluoroscopy Time:  1 minutes Radiation Exposure Index (if provided by the fluoroscopic device): 1.8 mGy COMPARISON:  None. FINDINGS: Suboptimal evaluation due to patient condition and inability to tolerate large boluses. Within this limitation trauma there is no esophageal mass or stricture identified. There is mild dysmotility. No significant hiatal hernia or gastroesophageal reflux. IMPRESSION: Technically suboptimal evaluation without significant abnormality. Electronically Signed   By: Macy Mis M.D.   On: 01/14/2020 16:53     Scheduled Meds:    . amLODipine  5 mg Oral Daily  . chlorhexidine  15 mL Mouth Rinse BID  . docusate sodium  100 mg Oral BID  . enoxaparin (LOVENOX) injection  30 mg Subcutaneous Q24H  . feeding supplement  237 mL Oral BID BM  . fentaNYL      . levothyroxine  125 mcg Oral Daily  . mouth rinse  15 mL Mouth Rinse q12n4p  . metoprolol tartrate  25 mg Oral Daily  . midazolam      . sertraline  100 mg Oral Daily  . sodium chloride flush  3 mL Intravenous Q12H  . umeclidinium bromide  1 puff Inhalation Daily    Continuous Infusions:   . sodium chloride 125 mL/hr at 01/14/20  1245     LOS: 2 days     Vernell Leep, MD, [place], [facility]. [facility]    To contact the attending provider between 7A-7P or the covering provider during after hours 7P-7A, please log into the web site [email or web address] and access using universal [place] password for that web site. If you do not have the password, please call the hospital operator.  01/15/2020, 1:34 PM

## 2020-01-15 NOTE — Procedures (Signed)
Interventional Radiology Procedure Note  Procedure: CT guided bone marrow aspiration and biopsy  Complications: None  EBL: < 10 mL  Findings: Aspirate and core biopsy performed of bone marrow in right iliac bone.  Plan: Bedrest supine x 1 hrs  Shaquala Broeker T. Ireene Ballowe, M.D Pager:  [phone]   

## 2020-01-15 NOTE — Progress Notes (Signed)
Pt post bone marrow aspiration VSS and dressing to lower back clean dry and intact. Maintain current plan of care for Pt.

## 2020-01-15 NOTE — Progress Notes (Signed)
Post bone marrow dressing remains clean dry and intact. VSS Pt resting quietly.

## 2020-01-15 NOTE — Progress Notes (Signed)
Palliative Medicine Team consult was received.   I met briefly with patient and her daughter.  Plan for family meeting with patient and her family (son and daughter) tomorrow at [facility].   If there are urgent needs or questions please call [phone]. Thank you for consulting out team to assist with this patients care.  Micheline Rough, MD [place] Team [phone]

## 2020-01-15 NOTE — Progress Notes (Signed)
(  no charge)  We had been asked yesterday to offer an opinion on this patient with known oropharyngeal dysphagia, to see whether there was a role for endoscopic dilatation.  I discussed the case with Dr. Janee Morn and we decided that the patient would undergo a barium swallow and, if that showed evidence of a stricture, achalasia, severe dysmotility of the esophagus, etc. that then we would get involved.  The patient's barium swallow has been completed and does not show any esophageal stricture or significant esophageal dysmotility.  Based on this finding, I do not think that there is a role for [place], and in particular, for esophageal dilatation.  Therefore, I will not plan to officially consult on this patient, unless you request otherwise.  Based on conversation with the speech therapist yesterday, this patient may end up needing enteral nutritional support (that is, an IR placed gastrostomy tube) if nutrition support is felt to be clinically appropriate.  Florencia Reasons, M.D. Pager [phone] If no answer or after 5 PM call [phone]

## 2020-01-15 NOTE — Progress Notes (Signed)
Initial Nutrition Assessment  DOCUMENTATION CODES:   Severe malnutrition in context of chronic illness, Underweight  INTERVENTION:  - will order Hormel Shake BID, each supplement provides 500 kcal and 22 grams protein. - will order Magic Cup with lunch meals, each supplement provides 290 kcal and 9 grams of protein. - will order 1 tablet multivitamin with minerals/day. - will monitor for plan concerning PEG placement.   NUTRITION DIAGNOSIS:   Severe Malnutrition related to chronic illness as evidenced by moderate fat depletion, moderate muscle depletion, severe fat depletion, severe muscle depletion.  GOAL:   Patient will meet greater than or equal to 90% of their needs  MONITOR:   PO intake, Supplement acceptance, Labs, Weight trends  REASON FOR ASSESSMENT:   Malnutrition Screening Tool, Consult Assessment of nutrition requirement/status  ASSESSMENT:   74 year old female with medical history of COPD, HTN, hyperlipidemia, hypothyroidism, and arthritis. She presented to the ED with complaints of weakness and emesis. Had a CT of her neck on 12/25/2019 which revealed a multifocal lytic osseous lesion in the left skull base, C2 vertebrae and right scapula and left lymphadenopathy.  She was referred to hematology/oncology, seen on 01/03/2020 and multiple myeloma work-up was initiated.  Admitted for dehydration with hyponatremia, hypercalcemia, suspected metastatic malignancy. S/p bone biopsy by IR on 10/20.  Patient underwent biopsy this AM and did not have breakfast d/t this. She was sipping coffee at the time of RD visit and son, who was at bedside provided most of the information. He reports that this information was mainly provided to him by his sister as he [place] and the patient has been staying with his sister/patient's daughter.   Dysphagia first became an issue ~2 months ago and has progressively worsened since that time. Patient eats very little during the day,  mainly a few small bites. She does not drink much fluid and although fluids seem to be easier than solid food, even sips of liquid are laborious for her.  Patient does indicate discomfort with swallowing and feeling like items are getting stuck in her throat. Chewing is not an issue but does require excessive energy.   She underwent MBS yesterday and recommendation was made for FLD.  She is able to ambulate but has been increasingly weak. She worked with PT some earlier today.   Due to poor intakes with worsening dysphagia, she has lost an unknown amount of weight in the past 2 months.   Chart review indicates weight on 10/18 was 81 lb and weight on 11/06/19 was 92 lb. This indicates 11 lb weight loss (12% body weight) in the past 2 months; significant for time frame. Of note, high rate IV fluids and hyponatremia which may point to some degree of dehydration on admission.  Plan is for Palliative Care consult. PEG has not yet been discussed with family pending outcome of Palliative Care discussion.   Patient will be unable to meet nutrition needs orally.     Labs reviewed; Na: 128 mmol/l, Cl: 96 mmol/l. Medications reviewed; 100 mg colace BID, 125 mcg oral synthroid/day, 40 mEq Klor-Con x2 doses 10/19, 30 mmol IV KPhos x1 run 10/19. IVF; NS Z@ 125 mmol/l.    NUTRITION - FOCUSED PHYSICAL EXAM:    Most Recent Value  Orbital Region Moderate depletion  Upper Arm Region Severe depletion  Thoracic and Lumbar Region Severe depletion  Buccal Region Moderate depletion  Temple Region Moderate depletion  Clavicle Bone Region Severe depletion  Clavicle and Acromion Bone Region Severe  depletion  Scapular Bone Region Severe depletion  Dorsal Hand Severe depletion  Patellar Region Severe depletion  Anterior Thigh Region Unable to assess  Posterior Calf Region Severe depletion  Edema (RD Assessment) None  Hair Reviewed  Eyes Reviewed  Mouth Unable to assess  Skin Reviewed  Nails Reviewed        Diet Order:   Diet Order            Diet full liquid Room service appropriate? Yes; Fluid consistency: Thin  Diet effective now                 EDUCATION NEEDS:   Not appropriate for education at this time  Skin:  Skin Assessment: Skin Integrity Issues: Skin Integrity Issues:: Stage I, Stage II Stage I: sacrum Stage II: R buttocks  Last BM:  PTA/unknown  Height:   Ht Readings from Last 1 Encounters:  01/13/20 _0  (1.575 m)    Weight:   Wt Readings from Last 1 Encounters:  01/13/20 36.7 kg     Estimated Nutritional Needs:  Kcal:  1470-1655 kcal Protein:  70-80 grams Fluid:  >/= 1.6 L/day     Jarome Matin, MS, RD, LDN, CNSC Inpatient Clinical Dietitian RD pager # available in [facility]  After hours/weekend pager # available in [facility]

## 2020-01-16 ENCOUNTER — Ambulatory Visit: Payer: Medicare PPO | Admitting: Internal Medicine

## 2020-01-16 DIAGNOSIS — Z7189 Other specified counseling: Secondary | ICD-10-CM | POA: Diagnosis not present

## 2020-01-16 DIAGNOSIS — E43 Unspecified severe protein-calorie malnutrition: Secondary | ICD-10-CM | POA: Insufficient documentation

## 2020-01-16 DIAGNOSIS — R1310 Dysphagia, unspecified: Secondary | ICD-10-CM | POA: Diagnosis not present

## 2020-01-16 DIAGNOSIS — Z515 Encounter for palliative care: Secondary | ICD-10-CM

## 2020-01-16 DIAGNOSIS — M899 Disorder of bone, unspecified: Secondary | ICD-10-CM | POA: Diagnosis not present

## 2020-01-16 DIAGNOSIS — E860 Dehydration: Secondary | ICD-10-CM | POA: Diagnosis not present

## 2020-01-16 DIAGNOSIS — R627 Adult failure to thrive: Secondary | ICD-10-CM | POA: Diagnosis not present

## 2020-01-16 LAB — RENAL FUNCTION PANEL
Albumin: 2.6 g/dL — ABNORMAL LOW (ref 3.5–5.0)
Anion gap: 11 (ref 5–15)
BUN: 14 mg/dL (ref 8–23)
CO2: 21 mmol/L — ABNORMAL LOW (ref 22–32)
Calcium: 9.2 mg/dL (ref 8.9–10.3)
Chloride: 97 mmol/L — ABNORMAL LOW (ref 98–111)
Creatinine, Ser: 0.83 mg/dL (ref 0.44–1.00)
GFR, Estimated: >60 mL/min (ref >60)
Glucose, Bld: 83 mg/dL (ref 70–99)
Phosphorus: 2.4 mg/dL — ABNORMAL LOW (ref 2.5–4.6)
Potassium: 3.8 mmol/L (ref 3.5–5.1)
Sodium: 129 mmol/L — ABNORMAL LOW (ref 135–145)

## 2020-01-16 LAB — GLUCOSE, CAPILLARY
Glucose-Capillary: 69 mg/dL — ABNORMAL LOW (ref 70–99)
Glucose-Capillary: 73 mg/dL (ref 70–99)

## 2020-01-16 MED ORDER — SODIUM BICARBONATE 650 MG PO TABS
650.0000 mg | ORAL_TABLET | Freq: Once | ORAL | Status: AC
  Administered 2020-01-16: 650 mg via ORAL
  Filled 2020-01-16: qty 1

## 2020-01-16 MED ORDER — K PHOS MONO-SOD PHOS DI & MONO 155-852-130 MG PO TABS
250.0000 mg | ORAL_TABLET | Freq: Two times a day (BID) | ORAL | Status: AC
  Administered 2020-01-16 (×2): 250 mg via ORAL
  Filled 2020-01-16 (×2): qty 1

## 2020-01-16 MED ORDER — LIP MEDEX EX OINT
TOPICAL_OINTMENT | CUTANEOUS | Status: AC
  Filled 2020-01-16: qty 7

## 2020-01-16 MED ORDER — POTASSIUM PHOSPHATES 15 MMOLE/5ML IV SOLN
10.0000 mmol | Freq: Once | INTRAVENOUS | Status: AC
  Administered 2020-01-16: 10 mmol via INTRAVENOUS
  Filled 2020-01-16: qty 3.33

## 2020-01-16 NOTE — Progress Notes (Addendum)
PROGRESS NOTE   Hailey Moody  ZOX:[id number]    DOB: [birth year]    DOA: 01/13/2020  PCP: Donald Prose, MD   I have briefly reviewed patients previous medical records in [facility].  Chief Complaint  Patient presents with  . Weakness  . Emesis    Brief Narrative:  74 year old female with PMH of COPD, hypertension, hyperlipidemia, hypothyroidism, arthritis presented to the ED with complaints of weakness and emesis which were worsening over several days.  Recently had a CT scan of her neck on 12/25/2019 which revealed a multifocal lytic osseous lesion in the left skull base, C2 vertebrae and right scapula and left lymphadenopathy.  She was referred to hematology/oncology, seen by Dr. Lorenso Courier on 01/03/2020 and multiple myeloma work-up was initiated.  Admitted for dehydration with hyponatremia, hypercalcemia, suspected metastatic malignancy.  Medical oncology consulted.  S/p bone biopsy by IR on 10/20.  Palliative care team meeting with patient and family on 10/21.   Assessment & Plan:  Principal Problem:   Failure to thrive in adult Active Problems:   Hypercalcemia   Hyponatremia   Essential hypertension   Anxiety   COPD with chronic bronchitis (HCC)   Lytic bone lesions on xray   Pressure injury of skin   Dehydration   Weakness   Protein-calorie malnutrition, severe   Hypercalcemia/skeletal lytic lesions, likely secondary to malignancy.  Skeletal lytic lesions and lymphadenopathy noted on recent CT.  Oncology input appreciated.  They have discussed in detail with patient's daughter.  Due to current severe deconditioning, patient may not be able to tolerate treatment for malignancy if confirmed.  Family however wished to have a diagnosis prior to making further decisions.  S/p bone marrow biopsy by IR on 10/20.  Ruling out multiple myeloma.  Follow pathology results.  PET/CT as outpatient.  S/p IV hydration for hypercalcemia, s/p Zometa 4 mg IV x1 dose on 10/19.  As per  oncology, if bone marrow biopsy is negative then consider CT chest abdomen and pelvis for further evaluation.  Also as per oncology recommendation, patient is hospice eligible and palliative care team has been consulted and are meeting with patient/family this afternoon.  Corrected serum calcium: 10.3.  Hypercalcemia currently resolved with treatment.  If oncology is okay with following patient in the clinic with outstanding bone marrow biopsy results, she should be able to DC to SNF tomorrow pending bed availability and insurance authorization.  Communicated this with the [facility] team this morning.  Dehydration with hyponatremia  Secondary to poor oral intake and hypercalcemia  S/p IV fluids.  Hydration appears borderline and not volume overloaded at this time.  Sodium is improved from 124 on admission to 129.  Dehydration has resolved but patient at high risk for recurrent dehydration and associated complications due to poor oral intake.  Dysphagia  GI reviewed patient's barium swallow which did not show any esophageal stricture or significant esophageal dysmotility.  As per ST recommendations, thin liquids, nectar thick liquids (full liquids).  However this may not be adequate to build up her nutritional status enough for cancer treatment unless intake drastically improves.  Will await palliative care input regarding pursuing PEG tube etc. if warranted.  However oncology clearly do not recommend placement of PEG tube unless this is only a bridge with temporary loss of swallowing function.  They indicated that this is not likely to change the clinical course in this patient.  I discussed with Dr. Domingo Cocking, palliative care MD prior to his meeting today with family.  Hypokalemia/hypomagnesemia/hypophosphatemia  Replaced.  Periodically follow.  Anemia:  Likely related to malignancy/chronic disease.  Stable.  Hypothyroidism  TSH 20.906 on 01/03/2020.  Patient's daughter had reported to  previous hospitalist that patient had discussed it with her PCP and generic Synthroid was recently changed to name brand Synthroid and decision at that time was to repeat TFTs or follow-up with PCP and if still elevated subsequently to increase Synthroid.  Defer to outpatient follow-up with PCP.  COPD  Stable.  Continue Spiriva/albuterol.  Essential hypertension  Controlled on amlodipine and metoprolol.  Body mass index is 15.89 kg/m./Severe malnutrition  Consulted dietitian.  Severe deconditioning  PT recommended SNF.  Adult failure to thrive  Multifactorial but most likely due to metastatic malignancy complicating advanced age.  Await palliative care meeting for goals of care discussion.   Likely Dementia  Daughter reports evening sundowning  DVT prophylaxis: enoxaparin (LOVENOX) injection 30 mg Start: 01/15/20 2200     Code Status: DNR Family Communication: None at bedside.  I discussed in detail with patients daughter, updated care and answered questions. Disposition:  Status is: Inpatient  Remains inpatient appropriate because:Inpatient level of care appropriate due to severity of illness   Dispo: The patient is from: Home              Anticipated d/c is to: TBD.              Anticipated d/c date is: 1 day.              Patient currently is not medically stable to d/c.        Consultants:   Medical oncology Interventional radiology Palliative care medicine  Procedures:   Bone marrow biopsy by IR 10/20.  Antimicrobials:    Anti-infectives (From admission, onward)   None        Subjective:  Mild and appropriate low back pain at site of biopsy yesterday but no other complaints reported.  Objective:   Vitals:   01/16/20 0515 01/16/20 0754 01/16/20 0930 01/16/20 1140  BP: 134/66  139/69 125/72  Pulse: 87  87 72  Resp: 20  (!) 22 20  Temp: 97.7 F (36.5 C)  97.7 F (36.5 C) 97.9 F (36.6 C)  TempSrc: Oral  Oral Oral  SpO2: 92% 97% 96% 98%    Weight:    39.4 kg  Height:        General exam: Elderly female, small built, frail, chronically ill looking lying comfortably propped up in bed.  Oral mucosa moist.  Looks better than she did yesterday. Respiratory system: Clear to auscultation.  No increased work of breathing. Cardiovascular system: S1 & S2 heard, RRR. No JVD, murmurs, rubs, gallops or clicks. No pedal edema.  Telemetry was discontinued yesterday. Gastrointestinal system: Abdomen is nondistended, soft and nontender. No organomegaly or masses felt. Normal bowel sounds heard. Central nervous system: Alert and oriented x2. No focal neurological deficits. Extremities: Symmetric 5 x 5 power. Skin: No rashes, lesions or ulcers Psychiatry: Judgement and insight impaired. Mood & affect pleasant and better compared to yesterday.    Data Reviewed:   I have personally reviewed following labs and imaging studies   CBC: Recent Labs  Lab 01/13/20 1201 01/14/20 0502 01/15/20 0508  WBC 11.2* 9.4 10.4  NEUTROABS 9.8*  --  8.7*  HGB 12.3 10.0* 10.1*  HCT 37.5 29.2* 30.1*  MCV 83.5 82.0 84.6  PLT 572* 496* 460*    Basic Metabolic Panel: Recent Labs  Lab 01/14/20 0859 01/15/20  2706 01/16/20 0509  NA 126* 128* 129*  K 3.1* 4.6 3.8  CL 90* 96* 97*  CO2 24 21* 21*  GLUCOSE 84 73 83  BUN 20 15 14   CREATININE 0.80 0.75 0.83  CALCIUM 10.5* 9.7 9.2  MG 1.3* 2.1  --   PHOS 2.4* 2.6 2.4*    Liver Function Tests: Recent Labs  Lab 01/13/20 1201 01/13/20 1201 01/14/20 0859 01/15/20 0508 01/16/20 0509  AST 19  --  16  --   --   ALT 13  --  13  --   --   ALKPHOS 100  --  82  --   --   BILITOT 0.7  --  1.0  --   --   PROT 7.8  --  6.4*  --   --   ALBUMIN 3.2*   < > 2.6* 2.3* 2.6*   < > = values in this interval not displayed.    CBG: Recent Labs  Lab 01/14/20 1113 01/16/20 0003 01/16/20 0407  GLUCAP 84 69* 73    Microbiology Studies:   Recent Results (from the past 240 hour(s))  Respiratory Panel by  RT PCR (Flu A&B, Covid) - Nasopharyngeal Swab     Status: None   Collection Time: 01/13/20  3:57 PM   Specimen: Nasopharyngeal Swab  Result Value Ref Range Status   SARS Coronavirus 2 by RT PCR NEGATIVE NEGATIVE Final    Comment: (NOTE) SARS-CoV-2 target nucleic acids are NOT DETECTED.  The SARS-CoV-2 RNA is generally detectable in upper respiratoy specimens during the acute phase of infection. The lowest concentration of SARS-CoV-2 viral copies this assay can detect is 131 copies/mL. A negative result does not preclude SARS-Cov-2 infection and should not be used as the sole basis for treatment or other patient management decisions. A negative result may occur with  improper specimen collection/handling, submission of specimen other than nasopharyngeal swab, presence of viral mutation(s) within the areas targeted by this assay, and inadequate number of viral copies (<131 copies/mL). A negative result must be combined with clinical observations, patient history, and epidemiological information. The expected result is Negative.  Fact Sheet for Patients:  PinkCheek.be  Fact Sheet for Healthcare Providers:  GravelBags.it  This test is no t yet approved or cleared by the [place] FDA and  has been authorized for detection and/or diagnosis of SARS-CoV-2 by FDA under an Emergency Use Authorization (EUA). This EUA will remain  in effect (meaning this test can be used) for the duration of the COVID-19 declaration under Section 564(b)(1) of the Act, 21 U.S.C. section 360bbb-3(b)(1), unless the authorization is terminated or revoked sooner.     Influenza A by PCR NEGATIVE NEGATIVE Final   Influenza B by PCR NEGATIVE NEGATIVE Final    Comment: (NOTE) The Xpert Xpress SARS-CoV-2/FLU/RSV assay is intended as an aid in  the diagnosis of influenza from Nasopharyngeal swab specimens and  should not be used as a sole basis for  treatment. Nasal washings and  aspirates are unacceptable for Xpert Xpress SARS-CoV-2/FLU/RSV  testing.  Fact Sheet for Patients: PinkCheek.be  Fact Sheet for Healthcare Providers: GravelBags.it  This test is not yet approved or cleared by the [place] FDA and  has been authorized for detection and/or diagnosis of SARS-CoV-2 by  FDA under an Emergency Use Authorization (EUA). This EUA will remain  in effect (meaning this test can be used) for the duration of the  Covid-19 declaration under Section 564(b)(1) of the Act, 21  U.S.C.  section 360bbb-3(b)(1), unless the authorization is  terminated or revoked. Performed at [facility], [place], [place] [address], [place], [place] [address]      Radiology Studies:  CT BIOPSY  Result Date: 01/15/2020 CLINICAL DATA:  Lytic bone lesions, hypercalcemia and abnormal protein electrophoresis. Suspected multiple myeloma. Bone marrow biopsy required for further evaluation. EXAM: CT GUIDED BONE MARROW ASPIRATION AND BIOPSY ANESTHESIA/SEDATION: Versed 0.75 mg IV, Fentanyl 37.5 mcg IV Total Moderate Sedation Time:   16 minutes. The patient's level of consciousness and physiologic status were continuously monitored during the procedure by Radiology nursing. PROCEDURE: The procedure risks, benefits, and alternatives were explained to the patient. Questions regarding the procedure were encouraged and answered. The patient understands and consents to the procedure. A time out was performed prior to initiating the procedure. The right gluteal region was prepped with chlorhexidine. Sterile gown and sterile gloves were used for the procedure. Local anesthesia was provided with 1% Lidocaine. Under CT guidance, an 11 gauge On Control bone cutting needle was advanced from a posterior approach into the right iliac bone. Needle positioning was confirmed with CT. Initial non heparinized and heparinized  aspirate samples were obtained of bone marrow. Core biopsy was performed via the On Control drill needle. COMPLICATIONS: None FINDINGS: Inspection of initial aspirate did reveal visible particles. Intact core biopsy sample was obtained. IMPRESSION: CT guided bone marrow biopsy of right posterior iliac bone with both aspirate and core samples obtained. Electronically Signed   By: Aletta Edouard M.D.   On: 01/15/2020 10:50   CT BONE MARROW BIOPSY & ASPIRATION  Result Date: 01/15/2020 CLINICAL DATA:  Lytic bone lesions, hypercalcemia and abnormal protein electrophoresis. Suspected multiple myeloma. Bone marrow biopsy required for further evaluation. EXAM: CT GUIDED BONE MARROW ASPIRATION AND BIOPSY ANESTHESIA/SEDATION: Versed 0.75 mg IV, Fentanyl 37.5 mcg IV Total Moderate Sedation Time:   16 minutes. The patient's level of consciousness and physiologic status were continuously monitored during the procedure by Radiology nursing. PROCEDURE: The procedure risks, benefits, and alternatives were explained to the patient. Questions regarding the procedure were encouraged and answered. The patient understands and consents to the procedure. A time out was performed prior to initiating the procedure. The right gluteal region was prepped with chlorhexidine. Sterile gown and sterile gloves were used for the procedure. Local anesthesia was provided with 1% Lidocaine. Under CT guidance, an 11 gauge On Control bone cutting needle was advanced from a posterior approach into the right iliac bone. Needle positioning was confirmed with CT. Initial non heparinized and heparinized aspirate samples were obtained of bone marrow. Core biopsy was performed via the On Control drill needle. COMPLICATIONS: None FINDINGS: Inspection of initial aspirate did reveal visible particles. Intact core biopsy sample was obtained. IMPRESSION: CT guided bone marrow biopsy of right posterior iliac bone with both aspirate and core samples obtained.  Electronically Signed   By: Aletta Edouard M.D.   On: 01/15/2020 10:50   DG ESOPHAGUS W SINGLE CM (SOL OR THIN BA)  Result Date: 01/14/2020 CLINICAL DATA:  Dysphagia EXAM: ESOPHOGRAM/BARIUM SWALLOW TECHNIQUE: Single contrast examination was performed using thin barium. FLUOROSCOPY TIME:  Fluoroscopy Time:  1 minutes Radiation Exposure Index (if provided by the fluoroscopic device): 1.8 mGy COMPARISON:  None. FINDINGS: Suboptimal evaluation due to patient condition and inability to tolerate large boluses. Within this limitation trauma there is no esophageal mass or stricture identified. There is mild dysmotility. No significant hiatal hernia or gastroesophageal reflux. IMPRESSION: Technically suboptimal evaluation without significant abnormality. Electronically Signed  By: Macy Mis M.D.   On: 01/14/2020 16:53     Scheduled Meds:   . amLODipine  5 mg Oral Daily  . chlorhexidine  15 mL Mouth Rinse BID  . docusate sodium  100 mg Oral BID  . enoxaparin (LOVENOX) injection  30 mg Subcutaneous Q24H  . feeding supplement  237 mL Oral BID BM  . levothyroxine  125 mcg Oral Daily  . mouth rinse  15 mL Mouth Rinse q12n4p  . metoprolol tartrate  25 mg Oral Daily  . multivitamin  15 mL Oral Daily  . phosphorus  250 mg Oral BID  . sertraline  100 mg Oral Daily  . sodium chloride flush  3 mL Intravenous Q12H  . umeclidinium bromide  1 puff Inhalation Daily    Continuous Infusions:   . sodium chloride 1,000 mL (01/16/20 1256)  . potassium PHOSPHATE IVPB (in mmol) 10 mmol (01/16/20 0935)     LOS: 3 days     Vernell Leep, MD, [place], [facility]. [facility]    To contact the attending provider between 7A-7P or the covering provider during after hours 7P-7A, please log into the web site [email or web address] and access using universal [place] password for that web site. If you do not have the password, please call the hospital operator.  01/16/2020, 2:48 PM

## 2020-01-16 NOTE — Progress Notes (Signed)
SlP treatment session completed, full note to follow.  Recommended to advance diet to regular to allow pt to choose food items she can manage.  Obtained order from Dr Neale Burly and swallow precaution signs posted.  Thanks.   Rolena Infante, MS [facility] SLP Acute Rehab Services Office [phone] Pager [phone]

## 2020-01-16 NOTE — [place] FL2 (Signed)
[place] MEDICAID FL2 LEVEL OF CARE SCREENING TOOL     IDENTIFICATION  Patient Name: Hailey Moody Birthdate: Nov 24, 1945 Sex: female Admission Date (Current Location): 01/13/2020  [facility] and [place] Number:  Producer, television/film/video and Address:  [facility],  [address], [place] [address]      Provider Number: [id number]  Attending Physician Name and Address:  Elease Etienne, MD  Relative Name and Phone Number:       Current Level of Care: Hospital Recommended Level of Care: Skilled Nursing Facility Prior Approval Number:    Date Approved/Denied:   PASRR Number: Pending  Discharge Plan: SNF    Current Diagnoses: Patient Active Problem List   Diagnosis Date Noted   Protein-calorie malnutrition, severe 01/16/2020   Pressure injury of skin 01/14/2020   Dehydration    Weakness    Hypercalcemia 01/13/2020   Failure to thrive in adult 01/13/2020   Hyponatremia 01/13/2020   Essential hypertension 01/13/2020   Anxiety 01/13/2020   COPD with chronic bronchitis (HCC) 01/13/2020   Lytic bone lesions on xray 01/13/2020   New daily persistent headache 11/06/2019   Dysphagia 11/06/2019   Cervical radiculopathy 11/06/2019   Cervico-occipital neuralgia 11/06/2019   Cervicalgia 11/06/2019   Chronic neck pain 11/06/2019    Orientation RESPIRATION BLADDER Height & Weight     Self  Normal Incontinent Weight: 39.4 kg Height:  5\' 2"  (157.5 cm)  BEHAVIORAL SYMPTOMS/MOOD NEUROLOGICAL BOWEL NUTRITION STATUS      Incontinent Diet (full liquid, may need peg tube)  AMBULATORY STATUS COMMUNICATION OF NEEDS Skin   Limited Assist Verbally Normal                       Personal Care Assistance Level of Assistance  Bathing, Dressing, Feeding Bathing Assistance: Limited assistance Feeding assistance: Limited assistance Dressing Assistance: Limited assistance     Functional Limitations Info             SPECIAL CARE FACTORS FREQUENCY   PT (By licensed PT), OT (By licensed OT)     PT Frequency: 5 x weekly OT Frequency: 5 x weekly            Contractures Contractures Info: Not present    Additional Factors Info  Code Status, Allergies Code Status Info: DNR Allergies Info: Penicillins           Current Medications (01/16/2020):  This is the current hospital active medication list Current Facility-Administered Medications  Medication Dose Route Frequency Provider Last Rate Last Admin   acetaminophen (TYLENOL) tablet 650 mg  650 mg Oral Q6H PRN 01/18/2020, MD   650 mg at 01/16/20 1440   Or   acetaminophen (TYLENOL) suppository 650 mg  650 mg Rectal Q6H PRN 01/18/20, MD       albuterol (VENTOLIN HFA) 108 (90 Base) MCG/ACT inhaler 2 puff  2 puff Inhalation Q6H PRN Jae Dire, MD       amLODipine (NORVASC) tablet 5 mg  5 mg Oral Daily Jae Dire, MD   5 mg at 01/16/20 0944   chlorhexidine (PERIDEX) 0.12 % solution 15 mL  15 mL Mouth Rinse BID 01/18/20, MD   15 mL at 01/16/20 0946   docusate sodium (COLACE) capsule 100 mg  100 mg Oral BID 01/18/20, MD   100 mg at 01/16/20 0945   enoxaparin (LOVENOX) injection 30 mg  30 mg Subcutaneous Q24H Allred, Darrell K, PA-C  30 mg at 01/15/20 2041   feeding supplement (ENSURE ENLIVE / ENSURE PLUS) liquid 237 mL  237 mL Oral BID BM Jae Dire, MD   237 mL at 01/15/20 1605   HYDROmorphone (DILAUDID) injection 0.5 mg  0.5 mg Intravenous Q2H PRN Jae Dire, MD   0.5 mg at 01/15/20 1612   levothyroxine (SYNTHROID) tablet 125 mcg  125 mcg Oral Daily Jae Dire, MD   125 mcg at 01/16/20 0559   lip balm (CARMEX) ointment            MEDLINE mouth rinse  15 mL Mouth Rinse q12n4p Rodolph Bong, MD   15 mL at 01/16/20 1203   metoprolol tartrate (LOPRESSOR) tablet 25 mg  25 mg Oral Daily Jae Dire, MD   25 mg at 01/16/20 0944   multivitamin liquid 15 mL  15 mL Oral Daily Marcellus Scott D, MD   15 mL at 01/16/20 0946    phosphorus (K PHOS NEUTRAL) tablet 250 mg  250 mg Oral BID Marikay Alar, FNP   250 mg at 01/16/20 0944   sertraline (ZOLOFT) tablet 100 mg  100 mg Oral Daily Jae Dire, MD   100 mg at 01/16/20 0944   sodium chloride flush (NS) 0.9 % injection 3 mL  3 mL Intravenous Q12H Jae Dire, MD       traMADol Janean Sark) tablet 50 mg  50 mg Oral Q6H PRN Jae Dire, MD   50 mg at 01/16/20 0945   umeclidinium bromide (INCRUSE ELLIPTA) 62.5 MCG/INH 1 puff  1 puff Inhalation Daily Jae Dire, MD   1 puff at 01/16/20 0315     Discharge Medications: Please see discharge summary for a list of discharge medications.  Relevant Imaging Results:  Relevant Lab Results:   Additional Information [id number]  Eyla Tallon, Meriam Sprague, RN

## 2020-01-16 NOTE — Progress Notes (Signed)
Palliative care brief note ° °I met today with patient, her son, and her daughter. ° °- We completed MOST form today.  DNR, Limited additional interventions, IVF and ABX if indicated, no feeding tube. °- She would like to work to transition to rehab to see how much functional status she can regain while awaiting results of biopsy. °- Discussed with CM, SLP, and Dr. Hongalgi. ° °Full consult note to follow. ° ° , MD °[place] [facility] °[phone] ° °

## 2020-01-17 ENCOUNTER — Telehealth: Payer: Self-pay | Admitting: Hematology and Oncology

## 2020-01-17 ENCOUNTER — Inpatient Hospital Stay (HOSPITAL_COMMUNITY): Payer: Medicare PPO

## 2020-01-17 DIAGNOSIS — M899 Disorder of bone, unspecified: Secondary | ICD-10-CM | POA: Diagnosis not present

## 2020-01-17 DIAGNOSIS — I10 Essential (primary) hypertension: Secondary | ICD-10-CM | POA: Diagnosis not present

## 2020-01-17 DIAGNOSIS — Z7189 Other specified counseling: Secondary | ICD-10-CM | POA: Diagnosis not present

## 2020-01-17 DIAGNOSIS — R1310 Dysphagia, unspecified: Secondary | ICD-10-CM | POA: Diagnosis not present

## 2020-01-17 DIAGNOSIS — Z515 Encounter for palliative care: Secondary | ICD-10-CM | POA: Diagnosis not present

## 2020-01-17 DIAGNOSIS — E860 Dehydration: Secondary | ICD-10-CM | POA: Diagnosis not present

## 2020-01-17 DIAGNOSIS — R627 Adult failure to thrive: Secondary | ICD-10-CM | POA: Diagnosis not present

## 2020-01-17 LAB — RENAL FUNCTION PANEL
Albumin: 2.3 g/dL — ABNORMAL LOW (ref 3.5–5.0)
Anion gap: 12 (ref 5–15)
BUN: 11 mg/dL (ref 8–23)
CO2: 23 mmol/L (ref 22–32)
Calcium: 8.7 mg/dL — ABNORMAL LOW (ref 8.9–10.3)
Chloride: 95 mmol/L — ABNORMAL LOW (ref 98–111)
Creatinine, Ser: 0.76 mg/dL (ref 0.44–1.00)
GFR, Estimated: >60 mL/min
Glucose, Bld: 78 mg/dL (ref 70–99)
Phosphorus: 2.5 mg/dL (ref 2.5–4.6)
Potassium: 3.1 mmol/L — ABNORMAL LOW (ref 3.5–5.1)
Sodium: 130 mmol/L — ABNORMAL LOW (ref 135–145)

## 2020-01-17 LAB — RESPIRATORY PANEL BY RT PCR (FLU A&B, COVID)
Influenza A by PCR: NEGATIVE
Influenza B by PCR: NEGATIVE
SARS Coronavirus 2 by RT PCR: NEGATIVE

## 2020-01-17 LAB — SURGICAL PATHOLOGY

## 2020-01-17 MED ORDER — POLYETHYLENE GLYCOL 3350 17 G PO PACK
17.0000 g | PACK | Freq: Every day | ORAL | Status: DC
  Administered 2020-01-17: 17 g via ORAL
  Filled 2020-01-17: qty 1

## 2020-01-17 MED ORDER — ACETAMINOPHEN 325 MG PO TABS: 650.0000 mg | ORAL_TABLET | Freq: Four times a day (QID) | ORAL | Status: AC | PRN

## 2020-01-17 MED ORDER — ADULT MULTIVITAMIN LIQUID CH: 15.0000 mL | Freq: Every day | ORAL | Status: AC

## 2020-01-17 MED ORDER — POTASSIUM CHLORIDE CRYS ER 20 MEQ PO TBCR
40.0000 meq | EXTENDED_RELEASE_TABLET | ORAL | Status: AC
  Administered 2020-01-17 (×2): 40 meq via ORAL
  Filled 2020-01-17 (×2): qty 2

## 2020-01-17 MED ORDER — ENSURE ENLIVE PO LIQD: 237.0000 mL | Freq: Two times a day (BID) | ORAL | Status: AC

## 2020-01-17 MED ORDER — SENNA 8.6 MG PO TABS: 2.0000 | ORAL_TABLET | Freq: Every day | ORAL | Status: DC | PRN

## 2020-01-17 MED ORDER — SIMETHICONE 80 MG PO CHEW
80.0000 mg | CHEWABLE_TABLET | Freq: Four times a day (QID) | ORAL | Status: DC | PRN
  Administered 2020-01-17: 80 mg via ORAL
  Filled 2020-01-17: qty 1

## 2020-01-17 MED ORDER — TRAMADOL HCL 50 MG PO TABS
50.0000 mg | ORAL_TABLET | Freq: Two times a day (BID) | ORAL | 0 refills | Status: AC | PRN
Start: 2020-01-17 — End: ?

## 2020-01-17 MED ORDER — SENNA 8.6 MG PO TABS: 2.0000 | ORAL_TABLET | Freq: Every day | ORAL | Status: AC | PRN

## 2020-01-17 MED ORDER — SIMETHICONE 80 MG PO CHEW: 80.0000 mg | CHEWABLE_TABLET | Freq: Four times a day (QID) | ORAL | Status: AC | PRN

## 2020-01-17 MED ORDER — POLYETHYLENE GLYCOL 3350 17 G PO PACK: 17.0000 g | PACK | Freq: Two times a day (BID) | ORAL | Status: AC

## 2020-01-17 MED ORDER — DOCUSATE SODIUM 100 MG PO CAPS: 100.0000 mg | ORAL_CAPSULE | Freq: Two times a day (BID) | ORAL | Status: AC

## 2020-01-17 MED ORDER — BISACODYL 10 MG RE SUPP
10.0000 mg | Freq: Once | RECTAL | Status: AC
  Administered 2020-01-17: 10 mg via RECTAL
  Filled 2020-01-17: qty 1

## 2020-01-17 NOTE — Discharge Instructions (Signed)

## 2020-01-17 NOTE — Progress Notes (Signed)
   01/16/20 1715  SLP Visit Information  SLP Received On 01/16/20  General Information  Patient Positioning Upright in bed (reverse trendelenberg)  Oral care provided N/A  HPI 74 yo female adm to [facility] with h/o COPD, HTN, HLD, arthritis, recently had a CT of her neck on 12/25/2019 showing a multifocal lytic osseous lesion at left skull base, C2 vertebrae and right scapula and left lymphadenopathy.  Pt reports progressive dysphagia over the last few months - to the point where she is only consuming [employer].  Pt reports everything is "burning" when she swallows.  Temperature Spikes Noted No  Respiratory Status Room air  Oral Cavity - Dentition Dentures, top;Dentures, bottom  Patient observed directly with PO's Yes  Type of PO's observed Dysphagia 3 (soft);Thin liquids  Feeding Able to feed self  Liquids provided via Cup;Straw  Treatment Provided  Treatment provided Dysphagia  Dysphagia Treatment  Treatment Methods Skilled observation;Patient/caregiver education  Family/Caregiver Educated daughter  Oral Phase Signs & Symptoms Oral holding  Pharyngeal Phase Signs & Symptoms Delayed throat clear (pt continuing to wince with swallowing most notably with water, delayed throat clearing noted which appears to be coorelated to her discomfort)  Type of cueing Verbal;Tactile;Visual  Amount of cueing Moderate  Other treatment/comments Pt is s/p palliative meeting and note there are no plans for PEG tube.  SLP follow up to review dysphagia precautions/mitigation strategies.  Observe pt to consume soft sandwich and water, she intentionally takes very small bites and masticates well.  Throat clearing noted which appears coorelated to mild complaint of burning.  Given pt is aware of her dysphagia and self limits her intake- recommend her diet be advanced to regular.  Pt agreeable to plan =- son and daughter in room with pt and both educated to findings/recommendations. Advised pt to remove her dentures  nightly and brush her gums/tongue am and pm for oral health.  Provided esophageal dysmotility precautions in writing and verbally.  Further advised pt to use reverse trendelenburg for optimal positioning.      Pain Assessment  Pain Assessment No/denies pain  SLP Time Calculation  SLP Start Time (ACUTE ONLY) 1715  SLP Stop Time (ACUTE ONLY) 1805  SLP Time Calculation (min) (ACUTE ONLY) 50 min  Rolena Infante, MS [facility] SLP Acute Rehab Services Office [phone] Pager [phone]

## 2020-01-17 NOTE — TOC Transition Note (Signed)
Transition of Care [facility]) - CM/SW Discharge Note   Patient Details  Name: Hailey Moody MRN: [id number] Date of Birth: [birth year]  [facility] CM/SW Contact:  Bartholome Bill, RN Phone Number: 01/17/2020, 2:56 PM   Clinical Narrative:    This CM was notified by PMT MD yesterday that family was interested in SNF and gave permission to fax out FL2. Bed offers given today at bedside with daughter Jackelyn Hoehn. [facility] chosen and liaison contacted to accept bed. [facility] to transport. Insurance Berkley Harvey was approved with [facility] yesterday. Pt to go to room 125B and RN to call report to [phone].   Final next level of care: Skilled Nursing Facility Barriers to Discharge: No Barriers Identified   Patient Goals and CMS Choice Patient states their goals for this hospitalization and ongoing recovery are:: go home CMS Medicare.gov Compare Post Acute Care list provided to:: Patient Represenative (must comment) Choice offered to / list presented to : Adult Children  Discharge Placement              Patient chooses bed at: [facility] Patient to be transferred to facility by: [facility] Name of family member notified: [name] Patient and family notified of of transfer: 01/17/20  Discharge Plan and Services   Discharge Planning Services: CM Consult Post Acute Care Choice: Home Health               Readmission Risk Interventions No flowsheet data found.

## 2020-01-17 NOTE — Telephone Encounter (Signed)
Scheduled per sch msg. Called and left msg. Will get printout of avs at hospital

## 2020-01-17 NOTE — Progress Notes (Signed)
Physical Therapy Treatment Patient Details Name: Hailey Moody MRN: [id number] DOB: Feb 16, 1946 Today's Date: 01/17/2020    History of Present Illness Pt is 74 yo female with a history of COPD, hypertension, hyperlipidemia, hypothyroidism, arthritis who presented to the ED with weakness and emesis which has been worsening over the past several days.  Pt recently found to have multifocal lytic osseous lesions left skull base, C2 vertebrae and right scapula and left lymphadenopathy. Pt admitted with FTT, hypercalcemia, and hyponatremia.    PT Comments    Pt was pleased to be able to get up and mobilize. She remains weak. She fatigues fairly easily with activity. Assisted pt into recliner to sit up a bit before returning to bed. Continue to recommend ST SNF rehab.    Follow Up Recommendations  SNF     Equipment Recommendations  None recommended by PT    Recommendations for Other Services       Precautions / Restrictions Precautions Precautions: Fall Restrictions Weight Bearing Restrictions: No    Mobility  Bed Mobility Overal bed mobility: Needs Assistance Bed Mobility: Supine to Sit     Supine to sit: Min assist     General bed mobility comments: Assist for trunk and to scoot to EOB. Increased time required.  Transfers Overall transfer level: Needs assistance Equipment used: Rolling walker (2 wheeled) Transfers: Sit to/from Stand Sit to Stand: Min assist         General transfer comment: Assist to power up and steady. Cues for safety, hand placement. Increased time required.  Ambulation/Gait Ambulation/Gait assistance: Min assist Gait Distance (Feet): 20 Feet Assistive device: Rolling walker (2 wheeled) Gait Pattern/deviations: Decreased step length - right;Decreased step length - left;Decreased stride length;Trunk flexed     General Gait Details: Near shuffle gait. Cues for posture. Pt fatigues eaisly-noted she began to shake mid distance. Assist to steady  throughout distance.   Stairs             Wheelchair Mobility    Modified Rankin (Stroke Patients Only)       Balance Overall balance assessment: Needs assistance         Standing balance support: Bilateral upper extremity supported Standing balance-Leahy Scale: Poor                              Cognition Arousal/Alertness: Awake/alert Behavior During Therapy: WFL for tasks assessed/performed Overall Cognitive Status: Within Functional Limits for tasks assessed                                        Exercises Total Joint Exercises Long Arc Quad: AROM;Both;10 reps;Seated    General Comments        Pertinent Vitals/Pain Pain Assessment: Faces Faces Pain Scale: Hurts little more Pain Location: back Pain Descriptors / Indicators: Aching;Discomfort;Sore Pain Intervention(s): Limited activity within patient's tolerance;Monitored during session;Repositioned    Home Living                      Prior Function            PT Goals (current goals can now be found in the care plan section) Progress towards PT goals: Progressing toward goals    Frequency    Min 3X/week      PT Plan Current plan remains appropriate    Co-evaluation  AM-PAC PT "6 Clicks" Mobility   Outcome Measure  Help needed turning from your back to your side while in a flat bed without using bedrails?: A Little Help needed moving from lying on your back to sitting on the side of a flat bed without using bedrails?: A Little Help needed moving to and from a bed to a chair (including a wheelchair)?: A Little Help needed standing up from a chair using your arms (e.g., wheelchair or bedside chair)?: A Little Help needed to walk in hospital room?: A Little Help needed climbing 3-5 steps with a railing? : A Lot 6 Click Score: 17    End of Session Equipment Utilized During Treatment: Gait belt Activity Tolerance: Patient tolerated  treatment well Patient left: in chair;with call bell/phone within reach   PT Visit Diagnosis: Muscle weakness (generalized) (M62.81);Unsteadiness on feet (R26.81)     Time: 1660-6004 PT Time Calculation (min) (ACUTE ONLY): 14 min  Charges:  $Gait Training: 8-22 mins                        Faye Ramsay, PT Acute Rehabilitation  Office: [phone] Pager: [phone]

## 2020-01-17 NOTE — Consult Note (Addendum)
Consultation Note Date: 01/17/2020   Patient Name: Hailey Moody  DOB: [birth year]  MRN: [id number]  Age / Sex: 74 y.o., female  PCP: Donald Prose, MD Referring Physician: Modena Jansky, MD  Reason for Consultation: Establishing goals of care  HPI/Patient Profile: 74 y.o. female  with past medical history of COPD, hypertension, hyperlipidemia, hypothyroidism and arthritis admitted on 01/13/2020 with weakness and emesis worsened over last several days.  She had been complaining of headaches and recently had CT scan of her neck which revealed multifocal lytic lesions of the left skull base, C2 vertebrae, right scapula and left level 4 lymphadenopathy.  She was referred to hematology and seen by Dr. Lorenso Courier and multiple myeloma work-up started.  She is currently admitted for hyponatremia with dehydration, hypercalcemia and suspected malignancy.  She had a biopsy done on 10/20 by IR.  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  Speech is evaluated her and she continues to have difficulty with some dysphagia and poor intake.  Palliative consulted for goals of care.  Clinical Assessment and Goals of Care: Palliative care consult received.  Chart reviewed including personal review of pertinent labs and imaging.  I met today with [name], her son, and her daughter.  Initially, on arrival to the room, [name] was being cleaned up by bedside staff.  I met with her son and daughter in a conference room.  They report that she is originally from [place], [place] and recently moved to the area to live with her daughter.  The things most important to her are her faith and her family.  She has 2 living children (1 daughter deceased as well) and her grandchildren and great-grandchildren.  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  We discussed her clinical course as well as options moving forward for her care.  Discussed the fact that she is very deconditioned and may not be a candidate for any disease modifying therapy even if biopsy reveals multiple myeloma.  Family understands this and they believe that [name] does as well, however, she has been clear that she wants results of biopsy prior to making decisions about long-term goals.  I then met with patient, her son, and her daughter.  We discussed clinical course as well as wishes moving forward in regard to advanced directives.  Concepts specific to code status and rehospitalization discussed.  We discussed difference between a aggressive medical intervention path and a palliative, comfort focused care path.  Values and goals of care important to patient and family were attempted to be elicited.  We discussed dysphagia and feeding tubes and risk and benefit of consideration for feeding tube in her situation.  We reviewed a MOST form and discussed how to develop plan of care to focus on continuing therapies that would maximize chance of being well enough to spend quality time with family and limiting therapies not in line with  this goal.  We also discussed options for care including consideration for trial of rehab and follow-up with Dr. Lorenso Courier to further discussed results of biopsy versus consideration for more comfort focus  path at this point.  Questions and concerns addressed.   PMT will continue to support holistically.  SUMMARY OF RECOMMENDATIONS   - DNR/DNI - She is not interested in feeding tube. - We completed MOST form today. DNR, Limited additional interventions, IVF and ABX if indicated, no feeding tube. -She would like to work to transition to skilled facility for trial of rehab awaiting results of biopsy.  She understands that she may not be a candidate for any further disease modifying therapy.  At the same time, she wants results of this prior  to making any decision about other long-term goals of care. -Recommend palliative care to follow-up at skilled facility.  We did discuss that she may benefit from consideration for hospice services depending upon results of biopsy and her functional status after trial at skilled facility to see if she can improve her nutrition and overall strength.  Code Status/Advance Care Planning: DNR   Prognosis:   Guarded  Discharge Planning: [place] for rehab with Palliative care service follow-up      Primary Diagnoses: Present on Admission: . Hypercalcemia   I have reviewed the medical record, interviewed the patient and family, and examined the patient. The following aspects are pertinent.  Past Medical History:  Diagnosis Date  . Arthritis   . Borderline diabetes   . COPD (chronic obstructive pulmonary disease) (HCC)    on spiriva, nebulizer  . Essential hypertension   . Fatigue    unspecified  . Heart attack ([place])    "mild"  . Hyperlipidemia   . Hypothyroidism   . Mini stroke ([place])   . Vitamin D deficiency    Social History   Socioeconomic History  . Marital status: Single    Spouse name: Not on file  . Number of children: Not on file  . Years of education: Not on file  . Highest education level: Not on file  Occupational History  . Not on file  Tobacco Use  . Smoking status: Current Every Day Smoker    Packs/day: 0.50    Years: 60.00    Pack years: 30.00    Types: Cigarettes    Start date: 81  . Smokeless tobacco: Never Used  Vaping Use  . Vaping Use: Never used  Substance and Sexual Activity  . Alcohol use: Not Currently  . Drug use: Never  . Sexual activity: Not Currently  Other Topics Concern  . Not on file  Social History Narrative   Lives with her grandson in [place] but currently here living with her daughter due to medical reasons   Right handed   Caffeine: about 2 cups/day lately, normally 5.   Social Determinants of Health    Financial Resource Strain:   . Difficulty of Paying Living Expenses: Not on file  Food Insecurity:   . Worried About Charity fundraiser in the Last Year: Not on file  . Ran Out of Food in the Last Year: Not on file  Transportation Needs:   . Lack of Transportation (Medical): Not on file  . Lack of Transportation (Non-Medical): Not on file  Physical Activity:   . Days of Exercise per Week: Not on file  . Minutes of Exercise per Session: Not on file  Stress:   . Feeling of Stress : Not  on file  Social Connections:   . Frequency of Communication with Friends and Family: Not on file  . Frequency of Social Gatherings with Friends and Family: Not on file  . Attends Religious Services: Not on file  . Active Member of Clubs or Organizations: Not on file  . Attends Archivist Meetings: Not on file  . Marital Status: Not on file   Family History  Problem Relation Age of Onset  . Diabetes type I Child   . Stroke Brother   . Heart attack Mother   . Heart attack Father    Scheduled Meds: . amLODipine  5 mg Oral Daily  . chlorhexidine  15 mL Mouth Rinse BID  . docusate sodium  100 mg Oral BID  . enoxaparin (LOVENOX) injection  30 mg Subcutaneous Q24H  . feeding supplement  237 mL Oral BID BM  . levothyroxine  125 mcg Oral Daily  . mouth rinse  15 mL Mouth Rinse q12n4p  . metoprolol tartrate  25 mg Oral Daily  . multivitamin  15 mL Oral Daily  . potassium chloride  40 mEq Oral Q4H  . sertraline  100 mg Oral Daily  . sodium chloride flush  3 mL Intravenous Q12H  . umeclidinium bromide  1 puff Inhalation Daily   Continuous Infusions: PRN Meds:.acetaminophen **OR** acetaminophen, albuterol, HYDROmorphone (DILAUDID) injection, traMADol Medications Prior to Admission:  Prior to Admission medications   Medication Sig Start Date End Date Taking? Authorizing Provider  albuterol (ACCUNEB) 1.25 MG/3ML nebulizer solution Take 3 mLs by nebulization every 6 (six) hours as needed  for wheezing or shortness of breath. Three times per day as needed.   Yes [provider]  albuterol (VENTOLIN HFA) 108 (90 Base) MCG/ACT inhaler Inhale 2 puffs into the lungs every 6 (six) hours as needed for wheezing or shortness of breath.   Yes [provider]  alendronate (FOSAMAX) 70 MG tablet Take 70 mg by mouth once a week. Take with a full glass of water on an empty stomach. Take on Mondays   Yes [provider]  amLODipine (NORVASC) 5 MG tablet Take 1 tablet (5 mg total) by mouth daily. 12/25/19  Yes Elouise Munroe, MD  ergocalciferol (VITAMIN D2) 1.25 MG (50000 UT) capsule Take 50,000 Units by mouth once a week. On Wednesdays   Yes [provider]  levothyroxine (SYNTHROID) 125 MCG tablet Take 125 mcg by mouth daily. 10/16/19  Yes [provider]  losartan (COZAAR) 100 MG tablet Take 100 mg by mouth daily. 08/21/19  Yes [provider]  metoprolol tartrate (LOPRESSOR) 25 MG tablet Take 25 mg by mouth daily. 08/21/19  Yes [provider]  sertraline (ZOLOFT) 100 MG tablet Take 100 mg by mouth daily.    Yes [provider]  SPIRIVA HANDIHALER 18 MCG inhalation capsule Place 1 capsule into inhaler and inhale daily. 10/16/19  Yes [provider]  traMADol (ULTRAM) 50 MG tablet Take 1 tablet (50 mg total) by mouth every 6 (six) hours as needed. Patient taking differently: Take 50 mg by mouth every 6 (six) hours as needed for moderate pain.  01/03/20  Yes Orson Slick, MD   Allergies  Allergen Reactions  . Penicillins     "passed out"  Did it involve swelling of the face/tongue/throat, SOB, or low BP? N Did it involve sudden or severe rash/hives, skin peeling, or any reaction on the inside of your mouth or nose? N Did you need to seek  medical attention at a hospital or doctor's office? Y. Occurred at hospital When did it last happen?Over 12 Years Ago If all above answers are "NO", may proceed with  cephalosporin use.    Review of Systems  Constitutional: Positive for activity change, appetite change and fatigue.  Gastrointestinal: Positive for nausea.  Neurological: Positive for weakness.  Psychiatric/Behavioral: Positive for sleep disturbance.   Physical Exam General: Alert, awake, in no acute distress.  Frail and chronically ill-appearing HEENT: No bruits, no goiter, no JVD Heart: Regular rate and rhythm. No murmur appreciated. Lungs: Good air movement, clear Abdomen: Soft, nontender, nondistended, positive bowel sounds.  Ext: No significant edema Skin: Warm and dry Neuro: Grossly intact, nonfocal.   Vital Signs: BP (!) 151/75 (BP Location: Left Arm)   Pulse 98   Temp 98.6 F (37 C) (Oral)   Resp 14   Ht _0  (1.575 m)   Wt 39.4 kg   SpO2 94%   BMI 15.89 kg/m  Pain Scale: PAINAD POSS *See Group Information*: 1-Acceptable,Awake and alert Pain Score: 5    SpO2: SpO2: 94 % O2 Device:SpO2: 94 % O2 Flow Rate: .O2 Flow Rate (L/min): 2 L/min  IO: Intake/output summary:   Intake/Output Summary (Last 24 hours) at 01/17/2020 0911 Last data filed at 01/17/2020 0542 Gross per 24 hour  Intake 6155.11 ml  Output 1300 ml  Net 4855.11 ml    LBM: Last BM Date: 01/08/20 Baseline Weight: Weight: 36.7 kg Most recent weight: Weight: 39.4 kg     Palliative Assessment/Data:     Time In: 1400 Time Out: 1530 Time Total: 90 Greater than 50%  of this time was spent counseling and coordinating care related to the above assessment and plan.  Signed by: Micheline Rough, MD   Please contact Palliative Medicine Team phone at [phone] for questions and concerns.  For individual provider: See Shea Evans

## 2020-01-17 NOTE — Progress Notes (Signed)
Patient was advised that MD ordered an enema. Patient stated she would like to hold off on the enema at this time. She had a small BM and would like to give time for suppository and miralax to work.

## 2020-01-17 NOTE — Discharge Summary (Signed)
Physician Discharge Summary  Hailey Moody HVF:[id number] DOB: 1945/05/05  PCP: Donald Prose, MD  Admitted from: Home Discharged to: SNF  Admit date: 01/13/2020 Discharge date: 01/17/2020  Recommendations for Outpatient Follow-up:    Follow-up Information    MD at SNF. Schedule an appointment as soon as possible for a visit.   Why: To be seen in 2 to 3 days with repeat labs (CBC & CMP).  Recommend palliative care consultation and follow-up.       Donald Prose, MD. Schedule an appointment as soon as possible for a visit.   Specialty: Family Medicine Why: Upon discharge from SNF. Contact information: [place] [address] [phone]        Orson Slick, MD. Schedule an appointment as soon as possible for a visit.   Specialty: Hematology and Oncology Why: MDs office will also arrange follow-up appointment to review bone marrow biopsy results and further treatment plans. Contact information: [address] [place] [address] [phone]                Home Health: N/A    Equipment/Devices: TBD at SNF.    Discharge Condition: Improved and stable.  Overall prognosis is poor.   Code Status: DNR Diet recommendation:  Discharge Diet Orders (From admission, onward)    Start     Ordered   01/17/20 0000  Diet general        01/17/20 1557           Discharge Diagnoses:  Principal Problem:   Failure to thrive in adult Active Problems:   Hypercalcemia   Hyponatremia   Essential hypertension   Anxiety   COPD with chronic bronchitis (HCC)   Lytic bone lesions on xray   Pressure injury of skin   Dehydration   Weakness   Protein-calorie malnutrition, severe   Brief Summary: 74 year old female with PMH of COPD, hypertension, hyperlipidemia, hypothyroidism, arthritis presented to the ED with complaints of weakness and emesis which were worsening over several days.  Recently had a CT scan of her neck on 12/25/2019 which  revealed a multifocal lytic osseous lesion in the left skull base, C2 vertebrae and right scapula and left lymphadenopathy.  She was referred to hematology/oncology, seen by Dr. Lorenso Courier on 01/03/2020 and multiple myeloma work-up was initiated.  Admitted for dehydration with hyponatremia, hypercalcemia, suspected metastatic malignancy.  Medical oncology consulted.  S/p bone biopsy by IR on 10/20.  Palliative care consulted and met with patient and family on 10/21.   Assessment & Plan:   Hypercalcemia/skeletal lytic lesions, likely secondary to malignancy.  Skeletal lytic lesions and lymphadenopathy noted on recent CT.  Oncology input appreciated.  They have discussed in detail with patient's daughter.  Due to current severe deconditioning, patient may not be able to tolerate treatment for malignancy if confirmed.  Family however wished to have a diagnosis prior to making further decisions.  S/p bone marrow biopsy by IR on 10/20.  Ruling out multiple myeloma.  Pathology results as noted below.  PET/CT as outpatient.  S/p IV hydration for hypercalcemia, s/p Zometa 4 mg IV x1 dose on 10/19.  As per oncology, if bone marrow biopsy is negative then consider CT chest abdomen and pelvis for further evaluation.  Also as per oncology recommendation, patient is hospice eligible and palliative care team consulted and met with patient and family yesterday, see recommendations below.  Corrected serum calcium: 10.3.  Hypercalcemia currently resolved with treatment.  I  communicated with the oncology service who have cleared patient for discharge to SNF today and they will arrange close outpatient follow-up in the office on Tuesday of next week to review results of bone marrow biopsy results and further evaluation and management plans.  Discontinued high-dose vitamin D supplements and Fosamax at discharge.  This can be reviewed during outpatient follow-up to determine if they need to be  resumed.  Dehydration with hyponatremia  Secondary to poor oral intake and hypercalcemia  S/p IV fluids.  Hydration appears borderline and not volume overloaded at this time.  Sodium is improved from 124 on admission to 129.  Dehydration has resolved but patient at high risk for recurrent dehydration and associated complications due to poor oral intake.  Dysphagia  GI reviewed patient's barium swallow which did not show any esophageal stricture or significant esophageal dysmotility.  As per ST recommendations, thin liquids, nectar thick liquids (full liquids).  However this may not be adequate to build up her nutritional status enough for cancer treatment unless intake drastically improves.  As per palliative care team meeting with patient and family, MOST form completed, DNR confirmed, limited additional interventions i.e. IVF and antibiotics if indicated and no feeding tube.  Patient would like to work to transition to rehab to see how much functional status she can regain while awaiting results of biopsy and further plans per oncology.  Speech therapy advanced her to regular consistency diet and thin liquids which she seems to be tolerating.  Hypokalemia/hypomagnesemia/hypophosphatemia  Replaced.  Periodically follow.  Anemia:  Likely related to malignancy/chronic disease.  Stable.  Hypothyroidism  TSH 20.906 on 01/03/2020.  Patient's daughter had reported to previous hospitalist that patient had discussed it with her PCP and generic Synthroid was recently changed to name brand Synthroid and decision at that time was to repeat TFTs or follow-up with PCP and if still elevated subsequently to increase Synthroid.  Defer to outpatient follow-up with PCP.  COPD  Stable.  Continue Spiriva/albuterol.  Essential hypertension  Controlled on amlodipine and metoprolol.  ARB discontinued due to risk of recurrent dehydration and AKI.  Body mass index is 15.89 kg/m./Severe  malnutrition  Consulted dietitian.  Severe deconditioning  PT recommended SNF.  Adult failure to thrive  Multifactorial but most likely due to metastatic malignancy complicating advanced age.  Palliative care recommendations as noted above.  Likely Dementia  Daughter reports evening sundowning  Hypokalemia:  Replaced prior to DC.  Follow BMP as outpatient.  Constipation  Optimize bowel regimen.  Increase MiraLAX to twice daily, continue Colace, as needed senna.  Adjust bowel regimen as needed at SNF.  Patient declined the enema.  Abdominal x-ray without acute findings except moderate stool     Consultants:   Medical oncology Interventional radiology Palliative care medicine  Procedures:   Bone marrow biopsy by IR 10/20.  Pathology results: DIAGNOSIS:   - Small B-cell population with possible kappa excess, see comment.   COMMENT:   There is a small population of lymphocytes (23%) with possible, but not  definitive kappa excess. There is no aberrant expression of CD5 or CD10.  Please see bone marrow [phone]) for further details.    Discharge Instructions  Discharge Instructions    Call MD for:   Complete by: As directed    Recurrent altered mental status or confusion.   Call MD for:  difficulty breathing, headache or visual disturbances   Complete by: As directed    Call MD for:  extreme fatigue   Complete  by: As directed    Call MD for:  persistant dizziness or light-headedness   Complete by: As directed    Call MD for:  persistant nausea and vomiting   Complete by: As directed    Call MD for:  severe uncontrolled pain   Complete by: As directed    Call MD for:  temperature >100.4   Complete by: As directed    Diet general   Complete by: As directed    Increase activity slowly   Complete by: As directed    No wound care   Complete by: As directed        Medication List    STOP taking these medications   alendronate 70 MG  tablet Commonly known as: FOSAMAX   ergocalciferol 1.25 MG (50000 UT) capsule Commonly known as: VITAMIN D2   losartan 100 MG tablet Commonly known as: COZAAR     TAKE these medications   acetaminophen 325 MG tablet Commonly known as: TYLENOL Take 2 tablets (650 mg total) by mouth every 6 (six) hours as needed for mild pain (or Fever >/= 101).   albuterol 1.25 MG/3ML nebulizer solution Commonly known as: ACCUNEB Take 3 mLs by nebulization every 6 (six) hours as needed for wheezing or shortness of breath. Three times per day as needed.   albuterol 108 (90 Base) MCG/ACT inhaler Commonly known as: VENTOLIN HFA Inhale 2 puffs into the lungs every 6 (six) hours as needed for wheezing or shortness of breath.   amLODipine 5 MG tablet Commonly known as: NORVASC Take 1 tablet (5 mg total) by mouth daily.   docusate sodium 100 MG capsule Commonly known as: COLACE Take 1 capsule (100 mg total) by mouth 2 (two) times daily.   feeding supplement Liqd Take 237 mLs by mouth 2 (two) times daily between meals. Start taking on: January 18, 2020   levothyroxine 125 MCG tablet Commonly known as: SYNTHROID Take 125 mcg by mouth daily.   metoprolol tartrate 25 MG tablet Commonly known as: LOPRESSOR Take 25 mg by mouth daily.   multivitamin Liqd Take 15 mLs by mouth daily. Start taking on: January 18, 2020   polyethylene glycol 17 g packet Commonly known as: MIRALAX / GLYCOLAX Take 17 g by mouth 2 (two) times daily.   senna 8.6 MG Tabs tablet Commonly known as: SENOKOT Take 2 tablets (17.2 mg total) by mouth daily as needed for mild constipation or moderate constipation.   sertraline 100 MG tablet Commonly known as: ZOLOFT Take 100 mg by mouth daily.   simethicone 80 MG chewable tablet Commonly known as: MYLICON Chew 1 tablet (80 mg total) by mouth 4 (four) times daily as needed for flatulence.   Spiriva HandiHaler 18 MCG inhalation capsule Generic drug: tiotropium Place 1  capsule into inhaler and inhale daily.   traMADol 50 MG tablet Commonly known as: ULTRAM Take 1 tablet (50 mg total) by mouth every 12 (twelve) hours as needed for moderate pain or severe pain. What changed:   when to take this  reasons to take this      Allergies  Allergen Reactions  . Penicillins     "passed out"  Did it involve swelling of the face/tongue/throat, SOB, or low BP? N Did it involve sudden or severe rash/hives, skin peeling, or any reaction on the inside of your mouth or nose? N Did you need to seek medical attention at a hospital or doctor's office? Y. Occurred at hospital When did it last happen?Over 47  Years Ago If all above answers are "NO", may proceed with cephalosporin use.       Procedures/Studies:  CT BIOPSY  Result Date: 01/15/2020 CLINICAL DATA:  Lytic bone lesions, hypercalcemia and abnormal protein electrophoresis. Suspected multiple myeloma. Bone marrow biopsy required for further evaluation. EXAM: CT GUIDED BONE MARROW ASPIRATION AND BIOPSY ANESTHESIA/SEDATION: Versed 0.75 mg IV, Fentanyl 37.5 mcg IV Total Moderate Sedation Time:   16 minutes. The patient's level of consciousness and physiologic status were continuously monitored during the procedure by Radiology nursing. PROCEDURE: The procedure risks, benefits, and alternatives were explained to the patient. Questions regarding the procedure were encouraged and answered. The patient understands and consents to the procedure. A time out was performed prior to initiating the procedure. The right gluteal region was prepped with chlorhexidine. Sterile gown and sterile gloves were used for the procedure. Local anesthesia was provided with 1% Lidocaine. Under CT guidance, an 11 gauge On Control bone cutting needle was advanced from a posterior approach into the right iliac bone. Needle positioning was confirmed with CT. Initial non heparinized and heparinized aspirate samples were obtained of bone  marrow. Core biopsy was performed via the On Control drill needle. COMPLICATIONS: None FINDINGS: Inspection of initial aspirate did reveal visible particles. Intact core biopsy sample was obtained. IMPRESSION: CT guided bone marrow biopsy of right posterior iliac bone with both aspirate and core samples obtained. Electronically Signed   By: Aletta Edouard M.D.   On: 01/15/2020 10:50   DG Chest Port 1 View  Result Date: 01/13/2020 CLINICAL DATA:  Weakness. EXAM: PORTABLE CHEST 1 VIEW COMPARISON:  None. FINDINGS: The heart size and mediastinal contours are within normal limits. Both lungs are clear. The visualized skeletal structures are unremarkable. IMPRESSION: No active disease. Electronically Signed   By: Marijo Conception M.D.   On: 01/13/2020 11:58   DG Abd Portable 1V  Result Date: 01/17/2020 CLINICAL DATA:  Weakness, emesis EXAM: PORTABLE ABDOMEN - 1 VIEW COMPARISON:  None. FINDINGS: Normal abdominal gas pattern. Moderate stool seen throughout the colon and within the rectal vault. No gross free intraperitoneal gas. No organomegaly. Vascular calcifications noted within the left upper quadrant and pelvis. IMPRESSION: Normal abdominal gas pattern.  Moderate stool. Electronically Signed   By: Fidela Salisbury MD   On: 01/17/2020 10:45   CT BONE MARROW BIOPSY & ASPIRATION  Result Date: 01/15/2020 CLINICAL DATA:  Lytic bone lesions, hypercalcemia and abnormal protein electrophoresis. Suspected multiple myeloma. Bone marrow biopsy required for further evaluation. EXAM: CT GUIDED BONE MARROW ASPIRATION AND BIOPSY ANESTHESIA/SEDATION: Versed 0.75 mg IV, Fentanyl 37.5 mcg IV Total Moderate Sedation Time:   16 minutes. The patient's level of consciousness and physiologic status were continuously monitored during the procedure by Radiology nursing. PROCEDURE: The procedure risks, benefits, and alternatives were explained to the patient. Questions regarding the procedure were encouraged and answered. The  patient understands and consents to the procedure. A time out was performed prior to initiating the procedure. The right gluteal region was prepped with chlorhexidine. Sterile gown and sterile gloves were used for the procedure. Local anesthesia was provided with 1% Lidocaine. Under CT guidance, an 11 gauge On Control bone cutting needle was advanced from a posterior approach into the right iliac bone. Needle positioning was confirmed with CT. Initial non heparinized and heparinized aspirate samples were obtained of bone marrow. Core biopsy was performed via the On Control drill needle. COMPLICATIONS: None FINDINGS: Inspection of initial aspirate did reveal visible particles. Intact core biopsy sample was  obtained. IMPRESSION: CT guided bone marrow biopsy of right posterior iliac bone with both aspirate and core samples obtained. Electronically Signed   By: Aletta Edouard M.D.   On: 01/15/2020 10:50   DG ESOPHAGUS W SINGLE CM (SOL OR THIN BA)  Result Date: 01/14/2020 CLINICAL DATA:  Dysphagia EXAM: ESOPHOGRAM/BARIUM SWALLOW TECHNIQUE: Single contrast examination was performed using thin barium. FLUOROSCOPY TIME:  Fluoroscopy Time:  1 minutes Radiation Exposure Index (if provided by the fluoroscopic device): 1.8 mGy COMPARISON:  None. FINDINGS: Suboptimal evaluation due to patient condition and inability to tolerate large boluses. Within this limitation trauma there is no esophageal mass or stricture identified. There is mild dysmotility. No significant hiatal hernia or gastroesophageal reflux. IMPRESSION: Technically suboptimal evaluation without significant abnormality. Electronically Signed   By: Macy Mis M.D.   On: 01/14/2020 16:53      Subjective: Patient seen this morning along with her daughter and patient's nurse at bedside.  Reports "gas pain" in her belly.  No BM since last enema on 10/19.  Passing some flatus.  Tolerating diet without nausea or vomiting.  Had small BMs after Dulcolax  suppository and patient declined enema.  Discharge Exam:  Vitals:   01/16/20 2010 01/17/20 0002 01/17/20 0540 01/17/20 1317  BP: 119/63 138/65 (!) 151/75 133/77  Pulse: 85 94 98 81  Resp: _0 Temp: 98.3 F (36.8 C) 99.4 F (37.4 C) 98.6 F (37 C) 98.7 F (37.1 C)  TempSrc: Oral Oral Oral Oral  SpO2: 95% 94% 94% 95%  Weight:      Height:         General exam: Elderly female, small built, frail, chronically ill looking lying comfortably sitting up in bed without distress.  Oral mucosa moist.    Has progressively looked better over the last 3 days. Respiratory system: Clear to auscultation.  No increased work of breathing. Cardiovascular system: S1 & S2 heard, RRR. No JVD, murmurs, rubs, gallops or clicks. No pedal edema.   Gastrointestinal system: Abdomen is nondistended, soft and nontender. No organomegaly or masses felt. Normal bowel sounds heard. Central nervous system: Alert and oriented x2. No focal neurological deficits. Extremities: Symmetric 5 x 5 power. Skin: No rashes, lesions or ulcers Psychiatry: Judgement and insight impaired but improved compared to 3 days ago. Mood & affect pleasant and appropriate.   The results of significant diagnostics from this hospitalization (including imaging, microbiology, ancillary and laboratory) are listed below for reference.     Microbiology: Recent Results (from the past 240 hour(s))  Respiratory Panel by RT PCR (Flu A&B, Covid) - Nasopharyngeal Swab     Status: None   Collection Time: 01/13/20  3:57 PM   Specimen: Nasopharyngeal Swab  Result Value Ref Range Status   SARS Coronavirus 2 by RT PCR NEGATIVE NEGATIVE Final    Comment: (NOTE) SARS-CoV-2 target nucleic acids are NOT DETECTED.  The SARS-CoV-2 RNA is generally detectable in upper respiratoy specimens during the acute phase of infection. The lowest concentration of SARS-CoV-2 viral copies this assay can detect is 131 copies/mL. A negative result does not  preclude SARS-Cov-2 infection and should not be used as the sole basis for treatment or other patient management decisions. A negative result may occur with  improper specimen collection/handling, submission of specimen other than nasopharyngeal swab, presence of viral mutation(s) within the areas targeted by this assay, and inadequate number of viral copies (<131 copies/mL). A negative result must be combined with clinical observations, patient history, and epidemiological  information. The expected result is Negative.  Fact Sheet for Patients:  PinkCheek.be  Fact Sheet for Healthcare Providers:  GravelBags.it  This test is no t yet approved or cleared by the [place] FDA and  has been authorized for detection and/or diagnosis of SARS-CoV-2 by FDA under an Emergency Use Authorization (EUA). This EUA will remain  in effect (meaning this test can be used) for the duration of the COVID-19 declaration under Section 564(b)(1) of the Act, 21 U.S.C. section 360bbb-3(b)(1), unless the authorization is terminated or revoked sooner.     Influenza A by PCR NEGATIVE NEGATIVE Final   Influenza B by PCR NEGATIVE NEGATIVE Final    Comment: (NOTE) The Xpert Xpress SARS-CoV-2/FLU/RSV assay is intended as an aid in  the diagnosis of influenza from Nasopharyngeal swab specimens and  should not be used as a sole basis for treatment. Nasal washings and  aspirates are unacceptable for Xpert Xpress SARS-CoV-2/FLU/RSV  testing.  Fact Sheet for Patients: PinkCheek.be  Fact Sheet for Healthcare Providers: GravelBags.it  This test is not yet approved or cleared by the [place] FDA and  has been authorized for detection and/or diagnosis of SARS-CoV-2 by  FDA under an Emergency Use Authorization (EUA). This EUA will remain  in effect (meaning this test can be used) for the  duration of the  Covid-19 declaration under Section 564(b)(1) of the Act, 21  U.S.C. section 360bbb-3(b)(1), unless the authorization is  terminated or revoked. Performed at [facility], [place] [address], [place], [place] [address]   Respiratory Panel by RT PCR (Flu A&B, Covid) - Nasopharyngeal Swab     Status: None   Collection Time: 01/17/20  2:18 PM   Specimen: Nasopharyngeal Swab  Result Value Ref Range Status   SARS Coronavirus 2 by RT PCR NEGATIVE NEGATIVE Final    Comment: (NOTE) SARS-CoV-2 target nucleic acids are NOT DETECTED.  The SARS-CoV-2 RNA is generally detectable in upper respiratoy specimens during the acute phase of infection. The lowest concentration of SARS-CoV-2 viral copies this assay can detect is 131 copies/mL. A negative result does not preclude SARS-Cov-2 infection and should not be used as the sole basis for treatment or other patient management decisions. A negative result may occur with  improper specimen collection/handling, submission of specimen other than nasopharyngeal swab, presence of viral mutation(s) within the areas targeted by this assay, and inadequate number of viral copies (<131 copies/mL). A negative result must be combined with clinical observations, patient history, and epidemiological information. The expected result is Negative.  Fact Sheet for Patients:  PinkCheek.be  Fact Sheet for Healthcare Providers:  GravelBags.it  This test is no t yet approved or cleared by the [place] FDA and  has been authorized for detection and/or diagnosis of SARS-CoV-2 by FDA under an Emergency Use Authorization (EUA). This EUA will remain  in effect (meaning this test can be used) for the duration of the COVID-19 declaration under Section 564(b)(1) of the Act, 21 U.S.C. section 360bbb-3(b)(1), unless the authorization is terminated or revoked sooner.     Influenza A  by PCR NEGATIVE NEGATIVE Final   Influenza B by PCR NEGATIVE NEGATIVE Final    Comment: (NOTE) The Xpert Xpress SARS-CoV-2/FLU/RSV assay is intended as an aid in  the diagnosis of influenza from Nasopharyngeal swab specimens and  should not be used as a sole basis for treatment. Nasal washings and  aspirates are unacceptable for Xpert Xpress SARS-CoV-2/FLU/RSV  testing.  Fact Sheet for Patients: PinkCheek.be  Fact Sheet for Healthcare Providers: GravelBags.it  This test is not yet approved or cleared by the [place] FDA and  has been authorized for detection and/or diagnosis of SARS-CoV-2 by  FDA under an Emergency Use Authorization (EUA). This EUA will remain  in effect (meaning this test can be used) for the duration of the  Covid-19 declaration under Section 564(b)(1) of the Act, 21  U.S.C. section 360bbb-3(b)(1), unless the authorization is  terminated or revoked. Performed at [facility], [place] [address] [place], [place] [address]      Labs: CBC: Recent Labs  Lab 01/13/20 1201 01/14/20 0502 01/15/20 0508  WBC 11.2* 9.4 10.4  NEUTROABS 9.8*  --  8.7*  HGB 12.3 10.0* 10.1*  HCT 37.5 29.2* 30.1*  MCV 83.5 82.0 84.6  PLT 572* 496* 460*    Basic Metabolic Panel: Recent Labs  Lab 01/13/20 1201 01/13/20 2141 01/14/20 0859 01/15/20 0508 01/16/20 0509 01/17/20 0447  NA 124*  --  126* 128* 129* 130*  K 3.8  --  3.1* 4.6 3.8 3.1*  CL 85*  --  90* 96* 97* 95*  CO2 26  --  24 21* 21* 23  GLUCOSE 95  --  84 73 83 78  BUN 24*  --  _0 CREATININE 0.96  --  0.80 0.75 0.83 0.76  CALCIUM 12.5* 11.9* 10.5* 9.7 9.2 8.7*  MG  --   --  1.3* 2.1  --   --   PHOS  --   --  2.4* 2.6 2.4* 2.5    Liver Function Tests: Recent Labs  Lab 01/13/20 1201 01/14/20 0859 01/15/20 0508 01/16/20 0509 01/17/20 0447  AST 19 16  --   --   --   ALT 13 13  --   --   --   ALKPHOS 100 82  --   --    --   BILITOT 0.7 1.0  --   --   --   PROT 7.8 6.4*  --   --   --   ALBUMIN 3.2* 2.6* 2.3* 2.6* 2.3*    CBG: Recent Labs  Lab 01/14/20 0738 01/14/20 1113 01/16/20 0003 01/16/20 0407  GLUCAP 81 84 69* 73     Urinalysis    Component Value Date/Time   COLORURINE YELLOW 01/13/2020 [place] 01/13/2020 1615   LABSPEC 1.006 01/13/2020 1615   PHURINE 7.0 01/13/2020 1615   GLUCOSEU NEGATIVE 01/13/2020 1615   HGBUR NEGATIVE 01/13/2020 1615   [place] 01/13/2020 [place] 01/13/2020 1615   PROTEINUR NEGATIVE 01/13/2020 1615   NITRITE NEGATIVE 01/13/2020 1615   LEUKOCYTESUR NEGATIVE 01/13/2020 1615   I discussed in detail with patient's daughter at bedside, updated care and answered all questions.   Time coordinating discharge: 45 minutes  SIGNED:  Vernell Leep, MD, [place], [facility]. [facility]  To contact the attending provider between 7A-7P or the covering provider during after hours 7P-7A, please log into the web site [email or web address] and access using universal [place] password for that web site. If you do not have the password, please call the hospital operator.

## 2020-01-18 ENCOUNTER — Encounter: Payer: Self-pay | Admitting: Hematology and Oncology

## 2020-01-18 NOTE — Progress Notes (Signed)
Daily Progress Note   Patient Name: Hailey Moody       Date: 01/18/2020 DOB: 1946/03/02  Age: 74 y.o. MRN#: [id number] Attending Physician: No att. providers found Primary Care Physician: Donald Prose, MD Admit Date: 01/13/2020  Reason for Consultation/Follow-up: Establishing goals of care  Subjective: I met today with patient and her daughter.  We discussed again regarding clinical course and plan for transition to rehab and follow-up with Dr. Lorenso Courier next week to discuss results of biopsy when they return.  I talked with oncology team who will work to get her scheduled at that end of next week.  Length of Stay: 4  Physical Exam         General: Alert, awake, in no acute distress.  Frail and chronically ill-appearing.  Sitting in bedside chair HEENT: No bruits, no goiter, no JVD Heart: Regular rate and rhythm. No murmur appreciated. Lungs: Good air movement, clear Abdomen: Soft, nontender, nondistended, positive bowel sounds.  Ext: No significant edema Skin: Warm and dry Neuro: Grossly intact, nonfocal.  Vital Signs: BP (!) 141/70 (BP Location: Left Arm)    Pulse 81    Temp 97.9 F (36.6 C) (Oral)    Resp 16    Ht 5' 2"  (1.575 m)    Wt 39.4 kg    SpO2 96%    BMI 15.89 kg/m  SpO2: SpO2: 96 % O2 Device: O2 Device: Room Air O2 Flow Rate: O2 Flow Rate (L/min): 2 L/min  Intake/output summary:   Intake/Output Summary (Last 24 hours) at 01/18/2020 0857 Last data filed at 01/17/2020 1319 Gross per 24 hour  Intake 180 ml  Output 150 ml  Net 30 ml   LBM: Last BM Date: 01/08/20 Baseline Weight: Weight: 36.7 kg Most recent weight: Weight: 39.4 kg       Palliative Assessment/Data:      Patient Active Problem List   Diagnosis Date Noted   Protein-calorie malnutrition, severe  01/16/2020   Pressure injury of skin 01/14/2020   Dehydration    Weakness    Hypercalcemia 01/13/2020   Failure to thrive in adult 01/13/2020   Hyponatremia 01/13/2020   Essential hypertension 01/13/2020   Anxiety 01/13/2020   COPD with chronic bronchitis ([place]) 01/13/2020   Lytic bone lesions on xray 01/13/2020   New daily persistent headache 11/06/2019  Dysphagia 11/06/2019   Cervical radiculopathy 11/06/2019   Cervico-occipital neuralgia 11/06/2019   Cervicalgia 11/06/2019   Chronic neck pain 11/06/2019    Palliative Care Assessment & Plan   Patient Profile: 74 y.o. female  with past medical history of COPD, hypertension, hyperlipidemia, hypothyroidism and arthritis admitted on 01/13/2020 with weakness and emesis worsened over last several days.  She had been complaining of headaches and recently had CT scan of her neck which revealed multifocal lytic lesions of the left skull base, C2 vertebrae, right scapula and left level 4 lymphadenopathy.  She was referred to hematology and seen by Dr. Lorenso Courier and multiple myeloma work-up started.  She is currently admitted for hyponatremia with dehydration, hypercalcemia and suspected malignancy.  She had a biopsy done on 10/20 by IR.  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  Speech is evaluated her and she continues to have difficulty with some dysphagia and poor intake.  Palliative consulted for goals of care.   Assessment: Patient Active Problem List   Diagnosis Date Noted   Protein-calorie malnutrition, severe 01/16/2020   Pressure injury of skin 01/14/2020   Dehydration    Weakness    Hypercalcemia 01/13/2020   Failure to thrive in adult 01/13/2020   Hyponatremia 01/13/2020   Essential hypertension 01/13/2020   Anxiety 01/13/2020   COPD with chronic bronchitis ([place]) 01/13/2020   Lytic bone lesions on xray 01/13/2020   New daily persistent  headache 11/06/2019   Dysphagia 11/06/2019   Cervical radiculopathy 11/06/2019   Cervico-occipital neuralgia 11/06/2019   Cervicalgia 11/06/2019   Chronic neck pain 11/06/2019     Recommendations/Plan: - DNR/DNI - She is not interested in feeding tube. - MOST form completed and on chart. DNR, Limited additional interventions, IVF and ABX if indicated, no feeding tube. -She would like to work to transition to skilled facility for trial of rehab while awaiting results of biopsy.  She understands that she may not be a candidate for any further disease modifying therapy.  At the same time, she wants results of this prior to making any decision about other long-term goals of care. -Recommend palliative care to follow-up at skilled facility.  We did discuss that she may benefit from consideration for hospice services depending upon results of biopsy and her functional status after trial at skilled facility to see if she can improve her nutrition and overall strength.  Code Status: Code Status History    Date Active Date Inactive Code Status Order ID Comments User Context   01/13/2020 1830 01/18/2020 0427 DNR [id number]  Harold Hedge, MD ED   Advance Care Planning Activity    Questions for Most Recent Historical Code Status (Order [id number])    Question Answer   In the event of cardiac or respiratory ARREST Do not call a code blue   In the event of cardiac or respiratory ARREST Do not perform Intubation, CPR, defibrillation or ACLS   In the event of cardiac or respiratory ARREST Use medication by any route, position, wound care, and other measures to relive pain and suffering. May use oxygen, suction and manual treatment of airway obstruction as needed for comfort.       Prognosis:   Unable to determine- will depend on result of biopsy and treatment options  Discharge Planning:  [facility] for rehab with Palliative care service follow-up  Care plan was discussed  with patient and her daughter  Thank you for allowing  the Palliative Medicine Team to assist in the care of this patient.   Total Time 20 Prolonged Time Billed No      Greater than 50%  of this time was spent counseling and coordinating care related to the above assessment and plan.  Micheline Rough, MD  Please contact Palliative Medicine Team phone at [phone] for questions and concerns.

## 2020-01-21 ENCOUNTER — Other Ambulatory Visit: Payer: Self-pay | Admitting: Hematology and Oncology

## 2020-01-21 DIAGNOSIS — M899 Disorder of bone, unspecified: Secondary | ICD-10-CM

## 2020-01-22 ENCOUNTER — Encounter: Payer: Medicare PPO | Admitting: Physical Therapy

## 2020-01-22 ENCOUNTER — Encounter: Payer: Self-pay | Admitting: Hematology and Oncology

## 2020-01-22 ENCOUNTER — Other Ambulatory Visit: Payer: Self-pay

## 2020-01-22 ENCOUNTER — Encounter (HOSPITAL_COMMUNITY): Payer: Self-pay | Admitting: Hematology and Oncology

## 2020-01-22 ENCOUNTER — Inpatient Hospital Stay: Payer: Medicare PPO

## 2020-01-22 ENCOUNTER — Inpatient Hospital Stay (HOSPITAL_BASED_OUTPATIENT_CLINIC_OR_DEPARTMENT_OTHER): Payer: Medicare PPO | Admitting: Hematology and Oncology

## 2020-01-22 VITALS — BP 119/69 | HR 95 | Temp 97.3°F | Resp 18 | Ht 62.0 in | Wt 81.8 lb

## 2020-01-22 DIAGNOSIS — M899 Disorder of bone, unspecified: Secondary | ICD-10-CM

## 2020-01-22 DIAGNOSIS — Z23 Encounter for immunization: Secondary | ICD-10-CM | POA: Diagnosis not present

## 2020-01-22 DIAGNOSIS — M5412 Radiculopathy, cervical region: Secondary | ICD-10-CM | POA: Diagnosis not present

## 2020-01-22 DIAGNOSIS — R531 Weakness: Secondary | ICD-10-CM | POA: Diagnosis not present

## 2020-01-22 DIAGNOSIS — R937 Abnormal findings on diagnostic imaging of other parts of musculoskeletal system: Secondary | ICD-10-CM | POA: Diagnosis not present

## 2020-01-22 DIAGNOSIS — M542 Cervicalgia: Secondary | ICD-10-CM | POA: Diagnosis not present

## 2020-01-22 DIAGNOSIS — F17210 Nicotine dependence, cigarettes, uncomplicated: Secondary | ICD-10-CM | POA: Diagnosis not present

## 2020-01-22 LAB — CBC WITH DIFFERENTIAL (CANCER CENTER ONLY)
Abs Immature Granulocytes: 0.05 10*3/uL (ref 0.00–0.07)
Basophils Absolute: 0 10*3/uL (ref 0.0–0.1)
Basophils Relative: 0 %
Eosinophils Absolute: 0 10*3/uL (ref 0.0–0.5)
Eosinophils Relative: 0 %
HCT: 30.5 % — ABNORMAL LOW (ref 36.0–46.0)
Hemoglobin: 10.3 g/dL — ABNORMAL LOW (ref 12.0–15.0)
Immature Granulocytes: 1 %
Lymphocytes Relative: 4 %
Lymphs Abs: 0.4 10*3/uL — ABNORMAL LOW (ref 0.7–4.0)
MCH: 27.5 pg (ref 26.0–34.0)
MCHC: 33.8 g/dL (ref 30.0–36.0)
MCV: 81.3 fL (ref 80.0–100.0)
Monocytes Absolute: 0.9 10*3/uL (ref 0.1–1.0)
Monocytes Relative: 9 %
Neutro Abs: 9.4 10*3/uL — ABNORMAL HIGH (ref 1.7–7.7)
Neutrophils Relative %: 86 %
Platelet Count: 481 10*3/uL — ABNORMAL HIGH (ref 150–400)
RBC: 3.75 MIL/uL — ABNORMAL LOW (ref 3.87–5.11)
RDW: 13.5 % (ref 11.5–15.5)
WBC Count: 10.8 10*3/uL — ABNORMAL HIGH (ref 4.0–10.5)
nRBC: 0 % (ref 0.0–0.2)

## 2020-01-22 LAB — CMP (CANCER CENTER ONLY)
ALT: 13 U/L (ref 0–44)
AST: 31 U/L (ref 15–41)
Albumin: 2.4 g/dL — ABNORMAL LOW (ref 3.5–5.0)
Alkaline Phosphatase: 112 U/L (ref 38–126)
Anion gap: 13 (ref 5–15)
BUN: 13 mg/dL (ref 8–23)
CO2: 27 mmol/L (ref 22–32)
Calcium: 10.4 mg/dL — ABNORMAL HIGH (ref 8.9–10.3)
Chloride: 87 mmol/L — ABNORMAL LOW (ref 98–111)
Creatinine: 0.8 mg/dL (ref 0.44–1.00)
GFR, Estimated: >60 mL/min (ref >60)
Glucose, Bld: 93 mg/dL (ref 70–99)
Potassium: 3.7 mmol/L (ref 3.5–5.1)
Sodium: 127 mmol/L — ABNORMAL LOW (ref 135–145)
Total Bilirubin: 0.5 mg/dL (ref 0.3–1.2)
Total Protein: 7 g/dL (ref 6.5–8.1)

## 2020-01-22 LAB — LACTATE DEHYDROGENASE: LDH: 327 U/L — ABNORMAL HIGH (ref 98–192)

## 2020-01-23 ENCOUNTER — Telehealth: Payer: Self-pay | Admitting: *Deleted

## 2020-01-23 ENCOUNTER — Encounter (HOSPITAL_COMMUNITY): Payer: Self-pay | Admitting: Hematology and Oncology

## 2020-01-23 NOTE — Telephone Encounter (Signed)
TCT patient's daughter, Jackelyn Hoehn. Advised that there are no earlier appt dates for pt's PET scan. Advised that I have called Central scheduling, provided auth # and was told there are no earlier appts. [name] voiced understanding

## 2020-01-24 ENCOUNTER — Encounter: Payer: Self-pay | Admitting: Hematology and Oncology

## 2020-01-24 ENCOUNTER — Other Ambulatory Visit: Payer: Self-pay

## 2020-01-24 ENCOUNTER — Non-Acute Institutional Stay: Payer: Medicare PPO | Admitting: Hospice

## 2020-01-24 DIAGNOSIS — Z515 Encounter for palliative care: Secondary | ICD-10-CM

## 2020-01-24 DIAGNOSIS — E43 Unspecified severe protein-calorie malnutrition: Secondary | ICD-10-CM

## 2020-01-24 NOTE — Progress Notes (Signed)
[facility] Consult Note Telephone: [phone]  Fax: [phone]  PATIENT NAME: Hailey Moody [address] [place] [address] [phone] (home)  DOB: 05-Jan-1946 MRN: [id number]  PRIMARY CARE PROVIDER:    Donald Prose, MD,  [place] [address] [phone]  REFERRING PROVIDER:   Martinique Blattenberger NP RESPONSIBLE PARTY: Self   Extended Emergency Contact Information Primary Emergency Contact: [facility] Phone: [phone] Relation: Daughter Secondary Emergency Contact: Barnes,Greg Mobile Phone: [phone] Relation: Relative  I met face to face with patient and family in home/facility.  RECOMMENDATIONS/PLAN:   Visit at the request of Martinique Blattenberger, NP for palliative consult. Visit consisted of building trust and discussions on Palliative Medicine as specialized medical care for people living with serious illness, aimed at facilitating better quality of life through symptoms relief, assisting with advance care plan and establishing goals of care. Extensive discussion with [name] who explained patient is awaiting PET scan 02/04/2020 to determine if patient has cancer or not. She also mentioned possible plans for patient to go back to [place]-  That is patient's wish.  Therapeutic listening and ample emotional support provided.  Discussion on the difference between Palliative and Hospice care. Palliative care and hospice have similar goals of managing symptoms, promoting comfort, improving quality of life, and maintaining a person's dignity. However, palliative care may be offered during any phase of a serious illness, while hospice care is usually offered when a person is expected to live for 6 months or less.  Advance Care Planning: Our advance care planning conversation included a discussion about:    The value and importance of advance care planning  Experiences with loved ones  who have been seriously ill or have died  Exploration of personal, cultural or spiritual beliefs that might influence medical decisions  Exploration of goals of care in the event of a sudden injury or illness  Identification and preparation of a healthcare agent  Review and updating or creation of an  advance directive document  CODE STATUS: Patient affirmed she is a DO NOT RESUSCITATE.  GOALS OF CARE: Goals of care include to maximize quality of life and symptom management.  Medical orders for scope of treatment selections include DO NOT RESUSCITATE, limited additional interventions, IV fluids as indicated, antibiotics as indicated, no feeding tube.  Follow up Palliative Care Visit: Palliative care will continue to follow for goals of care clarification and symptom management.  Follow-up in a month/as needed  Functional decline/Symptom Management:  Recent hospitalization 10/18 to 01/17/2020 For dehydration, weakness, failure to thrive in adult.  She improved in the hospital and was discharged to SNF for rehab. Poor appetite: Patient is currently on Ensure nutritional supplementation.  Speech therapist was present with patient during visit for evaluation.  Collaboration with Martinique Blattenberger, NP: Plan is to initiate mirtazapine 7.5 mg daily. Fatigue: PT OT is ongoing Nothing with no concerns; patient denied pain/discomfort, in no medical acuity. Palliative will continue to monitor for symptom management/decline and make recommendations as needed.   Family /Caregiver/Community Supports: Patient in SNF for rehab. Patient has children who are involved in her care.   I spent 1hour 46 minutes providing this initial consultation; time includes time spent with patient/family, chart review, provider coordination,  and documentation. More than 50% of the time in this consultation was spent on coordinating communication  CHIEF COMPLAIN/HISTORY OF PRESENT ILLNESS:  Hailey Moody is a 74 y.o. female  with multiple medical  problems including severe protein caloric malnutrition, history of COPD; recent Hypercalcemia/skeletal lytic lesions, likely secondary to malignancy Per chart review in epic. Palliative Care was asked to follow this patient by consultation request of Martinique Blattenberger, NP to help address advance care planning and goals of care.  CODE STATUS: DO NOT RESUSCITATE  PPS: 40%  HOSPICE ELIGIBILITY/DIAGNOSIS: TBD  PAST MEDICAL HISTORY:  Past Medical History:  Diagnosis Date  . Arthritis   . Borderline diabetes   . COPD (chronic obstructive pulmonary disease) (H[facility])    on spiriva, nebulizer  . Essential hypertension   . Fatigue    unspecified  . Heart attack ([place])    "mild"  . Hyperlipidemia   . Hypothyroidism   . Mini stroke ([place])   . Vitamin D deficiency     SOCIAL HX:  Social History   Tobacco Use  . Smoking status: Current Every Day Smoker    Packs/day: 0.50    Years: 60.00    Pack years: 30.00    Types: Cigarettes    Start date: 58  . Smokeless tobacco: Never Used  Substance Use Topics  . Alcohol use: Not Currently   FAMILY HX:  Family History  Problem Relation Age of Onset  . Diabetes type I Child   . Stroke Brother   . Heart attack Mother   . Heart attack Father     ALLERGIES:  Allergies  Allergen Reactions  . Penicillins     "passed out"  Did it involve swelling of the face/tongue/throat, SOB, or low BP? N Did it involve sudden or severe rash/hives, skin peeling, or any reaction on the inside of your mouth or nose? N Did you need to seek medical attention at a hospital or doctor's office? Y. Occurred at hospital When did it last happen?Over 52 Years Ago If all above answers are "NO", may proceed with cephalosporin use.      PERTINENT MEDICATIONS:  Outpatient Encounter Medications as of 01/24/2020  Medication Sig  . acetaminophen (TYLENOL) 325 MG tablet Take 2 tablets (650 mg total) by mouth every 6 (six) hours as needed  for mild pain (or Fever >/= 101).  Marland Kitchen albuterol (ACCUNEB) 1.25 MG/3ML nebulizer solution Take 3 mLs by nebulization every 6 (six) hours as needed for wheezing or shortness of breath. Three times per day as needed.  Marland Kitchen albuterol (VENTOLIN HFA) 108 (90 Base) MCG/ACT inhaler Inhale 2 puffs into the lungs every 6 (six) hours as needed for wheezing or shortness of breath.  Marland Kitchen amLODipine (NORVASC) 5 MG tablet Take 1 tablet (5 mg total) by mouth daily.  Marland Kitchen docusate sodium (COLACE) 100 MG capsule Take 1 capsule (100 mg total) by mouth 2 (two) times daily.  . feeding supplement (ENSURE ENLIVE / ENSURE PLUS) LIQD Take 237 mLs by mouth 2 (two) times daily between meals.  Marland Kitchen levothyroxine (SYNTHROID) 125 MCG tablet Take 125 mcg by mouth daily.  . metoprolol tartrate (LOPRESSOR) 25 MG tablet Take 25 mg by mouth daily.  . Multiple Vitamin (MULTIVITAMIN) LIQD Take 15 mLs by mouth daily.  . polyethylene glycol (MIRALAX / GLYCOLAX) 17 g packet Take 17 g by mouth 2 (two) times daily.  Marland Kitchen senna (SENOKOT) 8.6 MG TABS tablet Take 2 tablets (17.2 mg total) by mouth daily as needed for mild constipation or moderate constipation.  . sertraline (ZOLOFT) 100 MG tablet Take 100 mg by mouth daily.   . simethicone (MYLICON) 80 MG chewable tablet Chew 1 tablet (80 mg total) by mouth 4 (four)  times daily as needed for flatulence.  Marland Kitchen SPIRIVA HANDIHALER 18 MCG inhalation capsule Place 1 capsule into inhaler and inhale daily.  . traMADol (ULTRAM) 50 MG tablet Take 1 tablet (50 mg total) by mouth every 12 (twelve) hours as needed for moderate pain or severe pain.   No facility-administered encounter medications on file as of 01/24/2020.    PHYSICAL EXAM / ROS: Height:   62 inches    weight: 86.8 pounds BMI 15.9 General: In no acute distress, cooperative Cardiovascular: regular rate and rhythm, no chest pain reported Pulmonary: no cough, no shortness of breath, clear ant/post fields, normal respiratory effort Abdomen: soft, non  tender, positive bowel sounds in all quadrants GU: denies dysuria, no suprapubic tenderness Extremities: Mild nonpitting edema to bilateral ankle; encouraged/demonstrated elevation. Skin: no rashes to exposed skin Neurological: Weakness but otherwise non focal  Teodoro Spray, NP

## 2020-01-26 NOTE — Progress Notes (Signed)
[place] [phone]   [phone]  PROGRESS NOTE  Patient Care Team: Donald Prose, MD as PCP - General (Family Medicine)  Hematological/Oncological History # Lytic Bone Lesions 1) 12/25/2019: CT soft tissue neck performed revealed multifocal lytic osseous lesions: left skull base, C2 vertebra, and right scapula. In conjunction with left level IV lymphadenopathy 2) 01/03/2020: establish care with Dr. Lorenso Courier   Interval History:  Hailey Moody 74 y.o. female with medical history significant for lytic bone lesions and decompensation who presents for a follow up visit. The patient's last visit was on 01/03/2020 at which time she established care. In the interim since the last visit she was admitted with deconditioning from 01/13/2020 to 01/17/2020 and discharged to a rehab facility.   On exam today Hailey Moody is made by her daughter.  She reports that her swallowing is "a little bit better".  She notes that she is eating mostly pured foods and soups.  She has had some continued issues with constipation, but is taking senna docusate twice daily as well as MiraLAX as needed.  She has been having some issues with dry mouth as well.  She is a large skin lesion on her left forehead which has not changed in character, but remains suspicious for cutaneous malignancy.  Unfortunately she missed her PET CT scan while she was admitted in house.  A bone marrow biopsy performed at that time showed no evidence of a hematological malignancy.  She denies have any fevers, chills, sweats, nausea, vomiting or diarrhea.  A full 10 point ROS is listed below.  MEDICAL HISTORY:  Past Medical History:  Diagnosis Date   Arthritis    Borderline diabetes    COPD (chronic obstructive pulmonary disease) (HCC)    on spiriva, nebulizer   Essential hypertension    Fatigue    unspecified   Heart attack ([place])    "mild"   Hyperlipidemia    Hypothyroidism    Mini stroke (HCC)     Vitamin D deficiency     SURGICAL HISTORY: Past Surgical History:  Procedure Laterality Date   temporal artery biopsy Left 10/02/2019    SOCIAL HISTORY: Social History   Socioeconomic History   Marital status: Single    Spouse name: Not on file   Number of children: Not on file   Years of education: Not on file   Highest education level: Not on file  Occupational History   Not on file  Tobacco Use   Smoking status: Current Every Day Smoker    Packs/day: 0.50    Years: 60.00    Pack years: 30.00    Types: Cigarettes    Start date: 47   Smokeless tobacco: Never Used  Scientific laboratory technician Use: Never used  Substance and Sexual Activity   Alcohol use: Not Currently   Drug use: Never   Sexual activity: Not Currently  Other Topics Concern   Not on file  Social History Narrative   Lives with her grandson in [place] but currently here living with her daughter due to medical reasons   Right handed   Caffeine: about 2 cups/day lately, normally 5.   Social Determinants of Health   Financial Resource Strain:    Difficulty of Paying Living Expenses: Not on file  Food Insecurity:    Worried About Charity fundraiser in the Last Year: Not on file   [employer] of Food in the Last Year: Not on file  Transportation Needs:  Lack of Transportation (Medical): Not on file   Lack of Transportation (Non-Medical): Not on file  Physical Activity:    Days of Exercise per Week: Not on file   Minutes of Exercise per Session: Not on file  Stress:    Feeling of Stress : Not on file  Social Connections:    Frequency of Communication with Friends and Family: Not on file   Frequency of Social Gatherings with Friends and Family: Not on file   Attends Religious Services: Not on file   Active Member of Clubs or Organizations: Not on file   Attends Archivist Meetings: Not on file   Marital Status: Not on file  Intimate Partner Violence:    Fear of  Current or Ex-Partner: Not on file   Emotionally Abused: Not on file   Physically Abused: Not on file   Sexually Abused: Not on file    FAMILY HISTORY: Family History  Problem Relation Age of Onset   Diabetes type I Child    Stroke Brother    Heart attack Mother    Heart attack Father     ALLERGIES:  is allergic to penicillins.  MEDICATIONS:  Current Outpatient Medications  Medication Sig Dispense Refill   acetaminophen (TYLENOL) 325 MG tablet Take 2 tablets (650 mg total) by mouth every 6 (six) hours as needed for mild pain (or Fever >/= 101).     albuterol (ACCUNEB) 1.25 MG/3ML nebulizer solution Take 3 mLs by nebulization every 6 (six) hours as needed for wheezing or shortness of breath. Three times per day as needed.     albuterol (VENTOLIN HFA) 108 (90 Base) MCG/ACT inhaler Inhale 2 puffs into the lungs every 6 (six) hours as needed for wheezing or shortness of breath.     amLODipine (NORVASC) 5 MG tablet Take 1 tablet (5 mg total) by mouth daily. 30 tablet 3   docusate sodium (COLACE) 100 MG capsule Take 1 capsule (100 mg total) by mouth 2 (two) times daily.     feeding supplement (ENSURE ENLIVE / ENSURE PLUS) LIQD Take 237 mLs by mouth 2 (two) times daily between meals.     levothyroxine (SYNTHROID) 125 MCG tablet Take 125 mcg by mouth daily.     metoprolol tartrate (LOPRESSOR) 25 MG tablet Take 25 mg by mouth daily.     Multiple Vitamin (MULTIVITAMIN) LIQD Take 15 mLs by mouth daily.     polyethylene glycol (MIRALAX / GLYCOLAX) 17 g packet Take 17 g by mouth 2 (two) times daily.     senna (SENOKOT) 8.6 MG TABS tablet Take 2 tablets (17.2 mg total) by mouth daily as needed for mild constipation or moderate constipation.     sertraline (ZOLOFT) 100 MG tablet Take 100 mg by mouth daily.      simethicone (MYLICON) 80 MG chewable tablet Chew 1 tablet (80 mg total) by mouth 4 (four) times daily as needed for flatulence.     SPIRIVA HANDIHALER 18 MCG inhalation  capsule Place 1 capsule into inhaler and inhale daily.     traMADol (ULTRAM) 50 MG tablet Take 1 tablet (50 mg total) by mouth every 12 (twelve) hours as needed for moderate pain or severe pain. 10 tablet 0   No current facility-administered medications for this visit.    REVIEW OF SYSTEMS:   Constitutional: ( - ) fevers, ( - )  chills , ( - ) night sweats Eyes: ( - ) blurriness of vision, ( - ) double vision, ( - )  watery eyes Ears, nose, mouth, throat, and face: ( - ) mucositis, ( - ) sore throat Respiratory: ( - ) cough, ( - ) dyspnea, ( - ) wheezes Cardiovascular: ( - ) palpitation, ( - ) chest discomfort, ( - ) lower extremity swelling Gastrointestinal:  ( - ) nausea, ( - ) heartburn, ( - ) change in bowel habits Skin: ( - ) abnormal skin rashes Lymphatics: ( - ) new lymphadenopathy, ( - ) easy bruising Neurological: ( - ) numbness, ( - ) tingling, ( - ) new weaknesses Behavioral/Psych: ( - ) mood change, ( - ) new changes  All other systems were reviewed with the patient and are negative.  PHYSICAL EXAMINATION: ECOG PERFORMANCE STATUS: 4 - Bedbound  Vitals:   01/22/20 0948  BP: 119/69  Pulse: 95  Resp: 18  Temp: (!) 97.3 F (36.3 C)  SpO2: 98%   Filed Weights   01/22/20 0948  Weight: 81 lb 12.8 oz (37.1 kg)    GENERAL: chronically ill appearing elderly Caucasian female. alert, no distress and comfortable SKIN: skin color, texture, turgor are normal, no rashes or significant lesions EYES: conjunctiva are pink and non-injected, sclera clear LUNGS: clear to auscultation and percussion with normal breathing effort HEART: regular rate & rhythm and no murmurs and no lower extremity edema Musculoskeletal: no cyanosis of digits and no clubbing  PSYCH: alert & oriented x 3, fluent speech NEURO: no focal motor/sensory deficits  LABORATORY DATA:  I have reviewed the data as listed CBC Latest Ref Rng & Units 01/22/2020 01/15/2020 01/14/2020  WBC 4.0 - 10.5 K/uL 10.8(H) 10.4  9.4  Hemoglobin 12.0 - 15.0 g/dL 10.3(L) 10.1(L) 10.0(L)  Hematocrit 36 - 46 % 30.5(L) 30.1(L) 29.2(L)  Platelets 150 - 400 K/uL 481(H) 460(H) 496(H)    CMP Latest Ref Rng & Units 01/22/2020 01/17/2020 01/16/2020  Glucose 70 - 99 mg/dL 93 78 83  BUN 8 - 23 mg/dL 13 11 14   Creatinine 0.44 - 1.00 mg/dL 0.80 0.76 0.83  Sodium 135 - 145 mmol/L 127(L) 130(L) 129(L)  Potassium 3.5 - 5.1 mmol/L 3.7 3.1(L) 3.8  Chloride 98 - 111 mmol/L 87(L) 95(L) 97(L)  CO2 22 - 32 mmol/L 27 23 21(L)  Calcium 8.9 - 10.3 mg/dL 10.4(H) 8.7(L) 9.2  Total Protein 6.5 - 8.1 g/dL 7.0 - -  Total Bilirubin 0.3 - 1.2 mg/dL 0.5 - -  Alkaline Phos 38 - 126 U/L 112 - -  AST 15 - 41 U/L 31 - -  ALT 0 - 44 U/L 13 - -    Lab Results  Component Value Date   MPROTEIN 0.5 (H) 01/03/2020   Lab Results  Component Value Date   KPAFRELGTCHN 45.4 (H) 01/03/2020   LAMBDASER 25.9 01/03/2020   KAPLAMBRATIO 8.18 01/06/2020   KAPLAMBRATIO 1.75 (H) 01/03/2020      PATHOLOGY: Surgical Pathology  CASE: [id number]  PATIENT: Hailey Moody  Flow Pathology Report   Clinical history: Lytic bone lesions/abnormal SPEP   DIAGNOSIS:   - Small B-cell population with possible kappa excess, see comment.   COMMENT:   There is a small population of lymphocytes (23%) with possible, but not  definitive kappa excess. There is no aberrant expression of CD5 or CD10.  Please see bone marrow [phone]) for further details.   DIAGNOSIS:   BONE MARROW, ASPIRATE, CLOT, CORE:  - Normocellular marrow with myeloid hyperplasia.  - Small lymphoid aggregates present, see comment.   PERIPHERAL BLOOD:  - Normocytic anemia.  - Mild thrombocytosis.  COMMENT:   The marrow is normocellular for age but exhibits a myeloid hyperplasia  and peripheral neutrophilia. The patient's IgM kappa paraprotein and  radiographic lytic lesions are noted. There is no significant increase  in plasma cells and the plasma cells present are polytypic.  There are a  few small lymphoid aggregates which are not overtly atypical. Flow  cytometry ([id number]) reveals a small population of B-cells with  possible, but not definitive kappa excess. There is no aberrant  expression of CD5 or CD10. Overall, the myeloid hyperplasia,  neutrophilia and thrombocytosis are likely reactive to the patient's  current clinical status. There is no definitive evidence of myeloma.  Given the IgM kappa paraprotein and a possible small B-cell population  with kappa excess by flow, testing for MYD88 (lymphoplasmacytic  lymphoma/Waldenstrom's) was ordered. Targeted biopsy of lytic lesions or  lymphadenopathy may be of utility in establishing a diagnosis given the  lack of overt lymphoid or plasma cell infiltrate in the marrow.   RADIOGRAPHIC STUDIES: CT BIOPSY  Result Date: 2020-02-12 CLINICAL DATA:  Lytic bone lesions, hypercalcemia and abnormal protein electrophoresis. Suspected multiple myeloma. Bone marrow biopsy required for further evaluation. EXAM: CT GUIDED BONE MARROW ASPIRATION AND BIOPSY ANESTHESIA/SEDATION: Versed 0.75 mg IV, Fentanyl 37.5 mcg IV Total Moderate Sedation Time:   16 minutes. The patient's level of consciousness and physiologic status were continuously monitored during the procedure by Radiology nursing. PROCEDURE: The procedure risks, benefits, and alternatives were explained to the patient. Questions regarding the procedure were encouraged and answered. The patient understands and consents to the procedure. A time out was performed prior to initiating the procedure. The right gluteal region was prepped with chlorhexidine. Sterile gown and sterile gloves were used for the procedure. Local anesthesia was provided with 1% Lidocaine. Under CT guidance, an 11 gauge On Control bone cutting needle was advanced from a posterior approach into the right iliac bone. Needle positioning was confirmed with CT. Initial non heparinized and heparinized aspirate  samples were obtained of bone marrow. Core biopsy was performed via the On Control drill needle. COMPLICATIONS: None FINDINGS: Inspection of initial aspirate did reveal visible particles. Intact core biopsy sample was obtained. IMPRESSION: CT guided bone marrow biopsy of right posterior iliac bone with both aspirate and core samples obtained. Electronically Signed   By: Aletta Edouard M.D.   On: Feb 12, 2020 10:50   DG Chest Port 1 View  Result Date: 01/13/2020 CLINICAL DATA:  Weakness. EXAM: PORTABLE CHEST 1 VIEW COMPARISON:  None. FINDINGS: The heart size and mediastinal contours are within normal limits. Both lungs are clear. The visualized skeletal structures are unremarkable. IMPRESSION: No active disease. Electronically Signed   By: Marijo Conception M.D.   On: 01/13/2020 11:58   DG Abd Portable 1V  Result Date: 01/17/2020 CLINICAL DATA:  Weakness, emesis EXAM: PORTABLE ABDOMEN - 1 VIEW COMPARISON:  None. FINDINGS: Normal abdominal gas pattern. Moderate stool seen throughout the colon and within the rectal vault. No gross free intraperitoneal gas. No organomegaly. Vascular calcifications noted within the left upper quadrant and pelvis. IMPRESSION: Normal abdominal gas pattern.  Moderate stool. Electronically Signed   By: Fidela Salisbury MD   On: 01/17/2020 10:45   CT BONE MARROW BIOPSY & ASPIRATION  Result Date: Feb 12, 2020 CLINICAL DATA:  Lytic bone lesions, hypercalcemia and abnormal protein electrophoresis. Suspected multiple myeloma. Bone marrow biopsy required for further evaluation. EXAM: CT GUIDED BONE MARROW ASPIRATION AND BIOPSY ANESTHESIA/SEDATION: Versed 0.75 mg IV, Fentanyl 37.5 mcg IV Total Moderate Sedation  Time:   16 minutes. The patient's level of consciousness and physiologic status were continuously monitored during the procedure by Radiology nursing. PROCEDURE: The procedure risks, benefits, and alternatives were explained to the patient. Questions regarding the procedure were  encouraged and answered. The patient understands and consents to the procedure. A time out was performed prior to initiating the procedure. The right gluteal region was prepped with chlorhexidine. Sterile gown and sterile gloves were used for the procedure. Local anesthesia was provided with 1% Lidocaine. Under CT guidance, an 11 gauge On Control bone cutting needle was advanced from a posterior approach into the right iliac bone. Needle positioning was confirmed with CT. Initial non heparinized and heparinized aspirate samples were obtained of bone marrow. Core biopsy was performed via the On Control drill needle. COMPLICATIONS: None FINDINGS: Inspection of initial aspirate did reveal visible particles. Intact core biopsy sample was obtained. IMPRESSION: CT guided bone marrow biopsy of right posterior iliac bone with both aspirate and core samples obtained. Electronically Signed   By: Aletta Edouard M.D.   On: 01/15/2020 10:50   DG ESOPHAGUS W SINGLE CM (SOL OR THIN BA)  Result Date: 01/14/2020 CLINICAL DATA:  Dysphagia EXAM: ESOPHOGRAM/BARIUM SWALLOW TECHNIQUE: Single contrast examination was performed using thin barium. FLUOROSCOPY TIME:  Fluoroscopy Time:  1 minutes Radiation Exposure Index (if provided by the fluoroscopic device): 1.8 mGy COMPARISON:  None. FINDINGS: Suboptimal evaluation due to patient condition and inability to tolerate large boluses. Within this limitation trauma there is no esophageal mass or stricture identified. There is mild dysmotility. No significant hiatal hernia or gastroesophageal reflux. IMPRESSION: Technically suboptimal evaluation without significant abnormality. Electronically Signed   By: Macy Mis M.D.   On: 01/14/2020 16:53    ASSESSMENT & PLAN Hailey Moody 74 y.o. female with medical history significant for COPD, HTN, HLD, hypothyroidism, and arthritis who presents for evaluation of a lytic lesion.  After review the labs, the records, discussion with the patient  the findings are most consistent with lytic bone lesions of unclear etiology.  The leading differential would be hematological malignancy versus other solid tumor metastatic neoplastic process.  I would recommend that we proceed with a PET CT scan in order to better quantify the lytic lesions in the skeleton as well as help determine if there is any additional malignancy which may be causing these findings.    In the interim I would recommend tramadol for pain control the patient continue to follow with physical therapy.  If there are concerning findings for solid tumor malignancy we will proceed with a biopsy in order to help confirm the diagnosis.  That being said the patient is badly deconditioned and I am not sure she would be a good candidate for treatments moving forward.  I would like to confirm the diagnosis prior to having goals of care discussions with the family, and additionally there may be some treatments we would offer her weakened state.  #Lytic Bone Lesions --findings are concerning for lytic bone lesions 2/2 to malignancy vs multiple myeloma. Will reorder a PET CT scan in order to evaluate for solid tumor vs active lytic lesions --MM workup showed IgM protein on SPEP. Subsequent BmBx showed no clear evidence of a hematological malignancy .  --PET scan will help determine if there is as site that can be biopsied.  --RTC once above workup is complete  #Skin Lesion on Forehead --lesion is suspicious for a cutaneous malignancy --recommend referral to dermatology for evaluation and consideration of biopsy.   #  Symptom management --continue working with physical therapy --tramadol 64m PO q6H PRN for pain control --continue to monitor   No orders of the defined types were placed in this encounter.  All questions were answered. The patient knows to call the clinic with any problems, questions or concerns.  A total of more than 30 minutes were spent on this encounter and over half  of that time was spent on counseling and coordination of care as outlined above.   JLedell Peoples MD [facility] Hematology/[facility]Phone: [phone]: [phone]: [email or web address]  01/26/2020 11:37 AM

## 2020-01-27 ENCOUNTER — Telehealth: Payer: Self-pay | Admitting: Hematology and Oncology

## 2020-01-27 NOTE — Telephone Encounter (Signed)
Called pt daughter per 10/31 sch msg - per daughter she will call back to schedule appt.   Referral pushed through [facility] .

## 2020-01-27 NOTE — Telephone Encounter (Signed)
Release: [id number] Faxed records to [facility] @ fax#[phone] and [facility] [place], [place] @ fax#[phone]

## 2020-01-28 ENCOUNTER — Ambulatory Visit (HOSPITAL_COMMUNITY): Payer: Medicare PPO

## 2020-01-28 ENCOUNTER — Telehealth: Payer: Self-pay | Admitting: *Deleted

## 2020-01-28 ENCOUNTER — Telehealth: Payer: Self-pay | Admitting: Hematology and Oncology

## 2020-01-28 NOTE — Telephone Encounter (Signed)
Called and spoke with patient. Confirmed appts  °

## 2020-01-28 NOTE — Telephone Encounter (Signed)
[facility] requested missing demographics facesheet for the referral that was sent to their office.  Faxed to [phone].  Phone to their office [phone]

## 2020-01-29 ENCOUNTER — Encounter (HOSPITAL_COMMUNITY): Payer: Self-pay | Admitting: Hematology and Oncology

## 2020-01-29 ENCOUNTER — Telehealth: Payer: Self-pay | Admitting: *Deleted

## 2020-01-29 NOTE — Telephone Encounter (Signed)
TCT pt's daughter, Jackelyn Hoehn regarding pt status and upcoming appts.  Pt is currently scheduled for a PET scan on 02/04/20 and she also has a dermatology appt next week as well, though [name] needs to change dermatology appt as it is on the same day as the PET scan. Pt remains at [facility]. She is unable to ambulate at all, needs to use w/c to even get to the bathroom. She continues to have trouble swallowing.  Apparently pt's Medicare days for the SNF end on 02/03/20 and she will be private pay after that. Jackelyn Hoehn is unclear as to when pt can move to [facility] is waiting for the facility there to let her know when they can take her. Informed [name] that I have heard from the Oncology group in [facility]. They have received pt's medical records but will hold on making an appt until they know when pt will be in [place]. They also needs a diagnosis as well. Jackelyn Hoehn is hoping that the PET scan will provide this much needed information, as well as information gained at [facility] appt.  Much emotional support extended. Encouraged  [name] to continue conversations with her mother about all of the above. [name] voices understanding about the difficulty with any prognosis as we don't have a diagnosis.

## 2020-02-02 LAB — SURGICAL PATHOLOGY

## 2020-02-04 ENCOUNTER — Ambulatory Visit (HOSPITAL_COMMUNITY): Payer: Medicare PPO

## 2020-02-11 ENCOUNTER — Telehealth: Payer: Self-pay | Admitting: Family Medicine

## 2020-02-11 NOTE — Telephone Encounter (Signed)
Please send my deepest condolences. I will make sure Dr Lucia Gaskins knows. TY.

## 2020-02-11 NOTE — Telephone Encounter (Signed)
Card sent to daughter.

## 2020-02-11 NOTE — Telephone Encounter (Signed)
Pt's daughter, Milagros Evener called, Pt has passed away. Gave Ms. Barnes our condolences.

## 2020-02-25 ENCOUNTER — Ambulatory Visit: Payer: Medicare PPO | Admitting: Physical Medicine and Rehabilitation

## 2020-03-02 ENCOUNTER — Ambulatory Visit: Payer: Medicare PPO | Admitting: Neurology

## 2020-03-14 ENCOUNTER — Other Ambulatory Visit: Payer: Self-pay | Admitting: Internal Medicine

## 2022-01-03 IMAGING — CT CT NECK W/ CM
3 of 6 series · 10 of 33 positions shown, 12 images · IV contrast (iopamidol)
Comparison: Cervical spine and brain MRI 11/20/2019.
COMPARISON: Cervical spine and brain MRI 11/20/2019.

Addendum:
CLINICAL DATA: 73-year-old female with vocal cord paralysis,
dysphagia, hypothyroidism, neck pain.

EXAM:
CT NECK WITH CONTRAST
TECHNIQUE: Multidetector CT imaging of the neck was performed using the
standard protocol following the bolus administration of intravenous
contrast.
CONTRAST:  75mL [id number] IOPAMIDOL ([id number]) INJECTION 61%

[Series 2: neck 2.00 br40 s3 st/ no angle · axial · 0.54mm/px · z∈[-779,-697]mm · 2 of 124 slices shown, 3 images]
[im 42/124  soft-tissue]
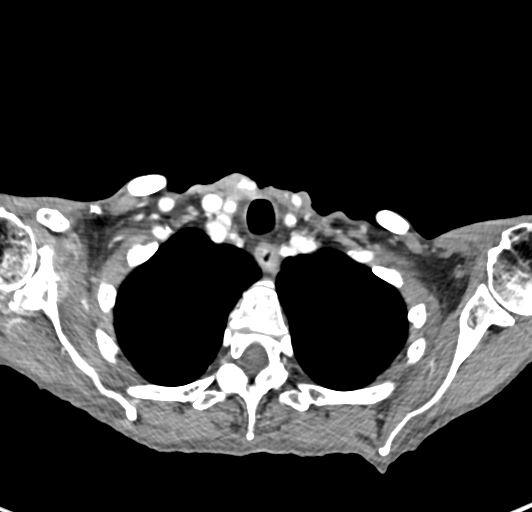
[im 42/124  bone]
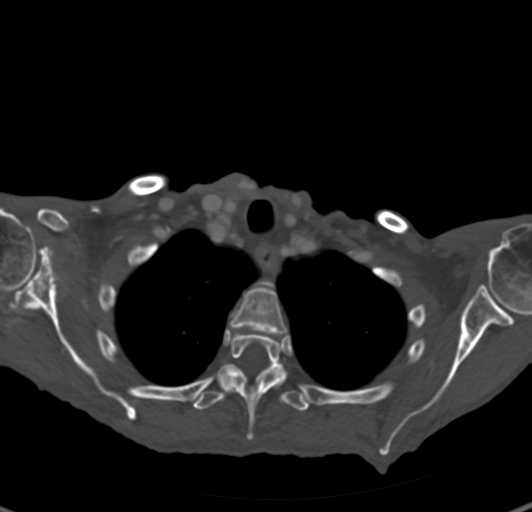
[im 83/124  bone]
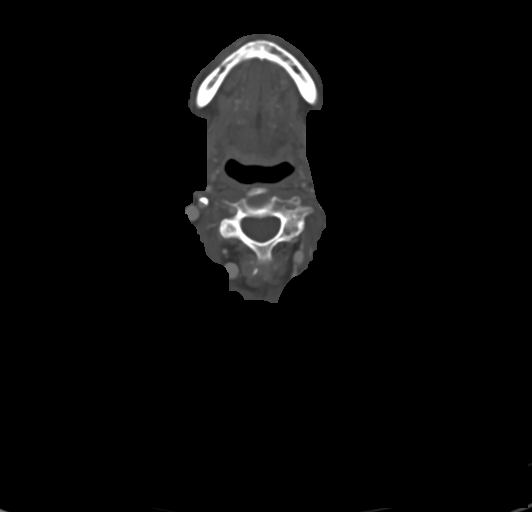

[Series 10: neck 2.00 br40 s3 (person_name) · coronal · 0.49mm/px · 3 of 128 slices shown (1 of 2)]
[im 26/128  bone]
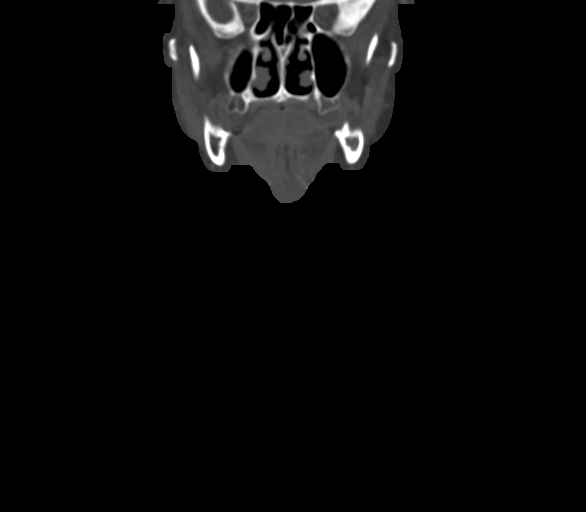
[im 51/128  bone]
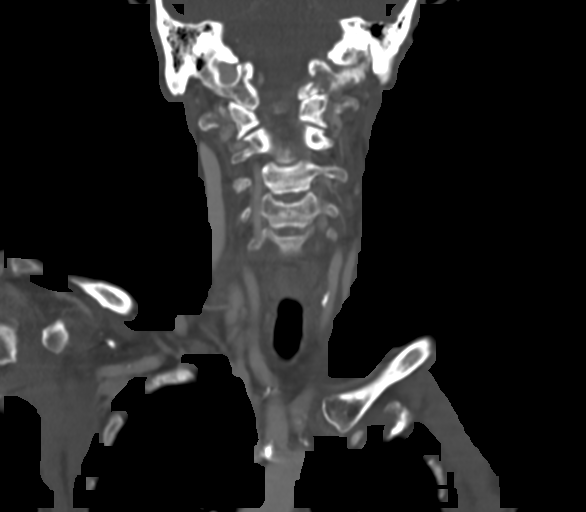
[im 77/128  bone]
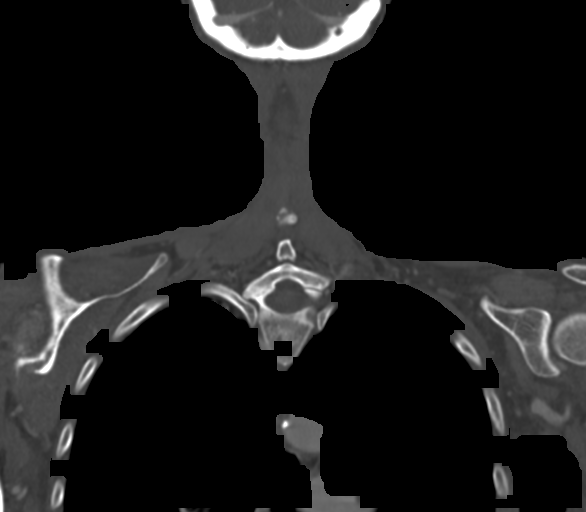

[Series 12: neck 2.00 br40 s3 (person_name) · sagittal · 0.49mm/px · 5 of 143 slices shown, 6 images (2 of 2)]
[im 48/143  bone]
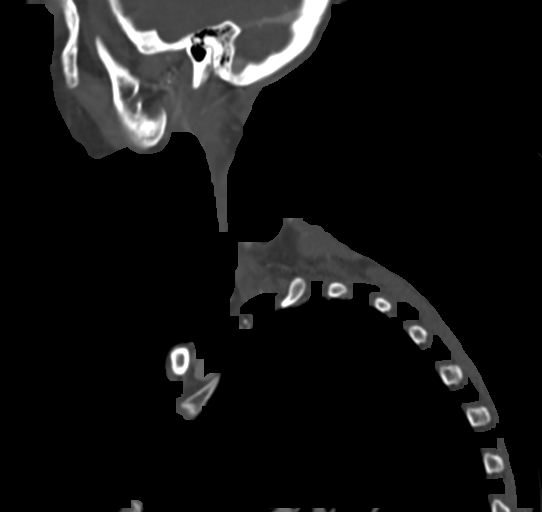
[im 60/143  bone]
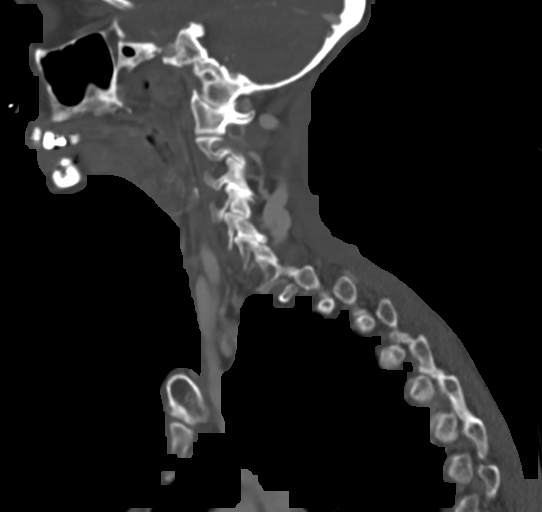
[im 72/143  soft-tissue]
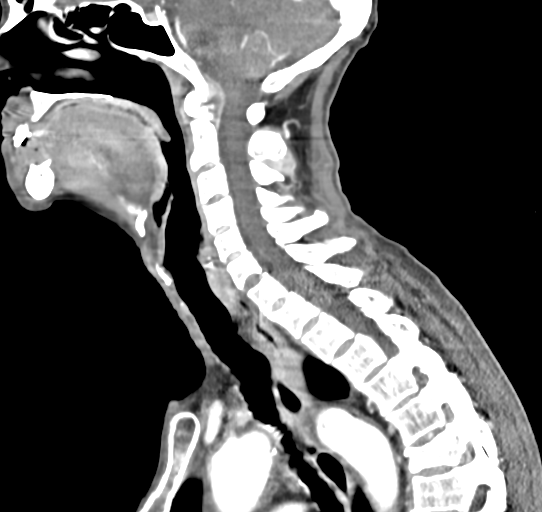
[im 72/143  bone]
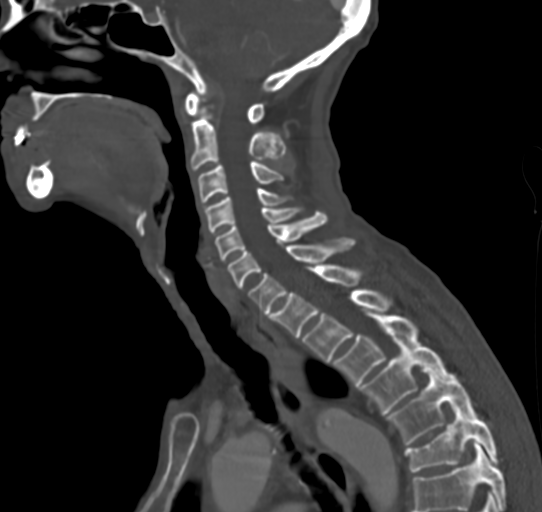
[im 83/143  bone]
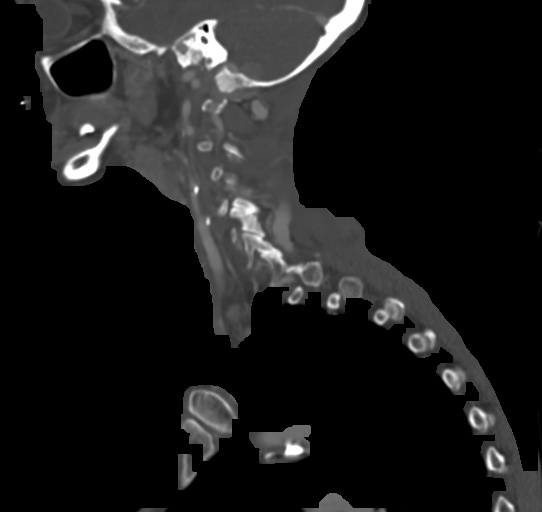
[im 95/143  bone]
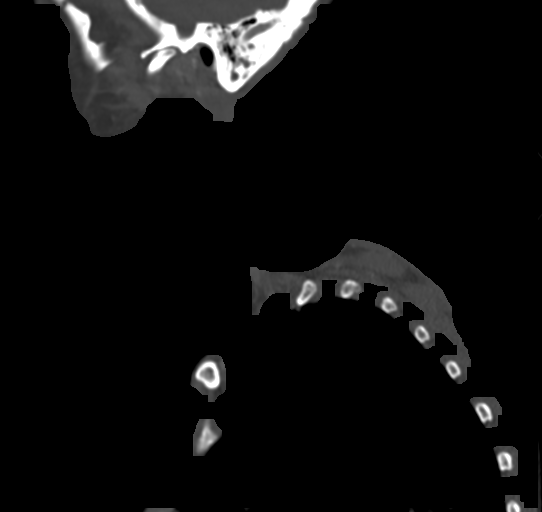

[10 of 33 positions shown; findings below may reference images not displayed]

FINDINGS: Pharynx and larynx: Mild motion artifact at the larynx, but evidence
of mild laryngeal asymmetry on the right including asymmetric
enlargement of the right piriform sinus. No discrete laryngeal mass
or hyperenhancement.

Pharyngeal soft tissue contours are within normal limits. Negative
parapharyngeal and retropharyngeal spaces. Unremarkable visible
cervical esophagus.

Salivary glands: Sublingual space, submandibular glands and parotid
glands are within normal limits.

Thyroid: Diminutive or absent, no definite thyroid parenchyma
identified.

Lymph nodes: Subcentimeter but heterogeneous and conspicuous left
level 4 lymph nodes are best demonstrated on series 10, image 57.
Individually these are 8 mm short axis (about 16 mm long axis, with
central hypodensity.

However, no other cervical lymphadenopathy identified.

Vascular: Bulky bilateral cervical carotid calcified
atherosclerosis, including high-grade stenoses at the bilateral
distal ICA bulbs, radiographic string sign on the left (series 2,
image 47) and approaching a string sign on the right (image 44).
None the less, the major vascular structures in the neck and at the
skull base are patent.

Limited intracranial: Small 12 mm enhancing mass projecting into the
lower left posterior fossa abutting the cerebellum (series 2, image
18), corresponding to a finding described on the brain MRI last
month. However, see skeletal findings below.

Visualized orbits: Negative.

Mastoids and visualized paranasal sinuses: Visible paranasal sinuses
are clear. Tympanic cavities remain clear.

However, there is a left mastoid effusion associated with
destruction of the left skull base, see below.

Skeleton: Lytic lesions in the left lower clivus (series 4, image
15), left occipital condyle, inferior left mastoid bone C2 posterior
elements (series 4, image 38), lateral right scapula (series 4,
image 91).

Extraosseous extension of tumor from the C2 spinous process (series
2, image 39). Pathologic fracture of the lateral right scapula.
Indeterminate moderate compression fracture of T6. Mild T3 superior
endplate compression.

Upper chest: Emphysema. Small 7 mm lung nodule in the anterior right
lower lobe on series 8, image 48. Most of the lungs are not
included. Calcified aortic atherosclerosis. Subcentimeter but
indeterminate left AP window lymph node measuring 8 mm on series 2,
image 116.

Other: Cachexia.
IMPRESSION: 1. Positive for multifocal lytic osseous lesions: left skull base,
C2 vertebra, and right scapula.
In conjunction with left level IV lymphadenopathy (see #2), favor
metastatic disease undetermined primary (see #3).

Pathologic fracture right scapula. Indeterminate compression
fractures of T3 and T6.

2. Small but malignant appearing lymph nodes at the left level IV
station. These along with the left skull base destruction could
predispose to left vocal cord paralysis, although the asymmetry of
the motion degraded larynx appears more compatible with a right side
cord paralysis. No right side causative lesion is identified.

3. Emphysema (LOPCE-1CY.N) with a suspicious 7 mm right lower lobe
pulmonary nodule in the visible lungs. Small but suspicious left AP
window lymph node.

4. Severe calcified atherosclerosis of both ICAs, with Severe
bilateral ICA bulb stenoses. Aortic Atherosclerosis (LOPCE-9AC.C).

ADDENDUM:
Study discussed by telephone with Dr. HAMIDAT URAMA on
12/24/2019 at 6160 hours.

He advised that it was in fact the Left vocal cord was paralyzed on
his exam - which is concordant with the identified left skull base
and/or left thoracic inlet lesions.

*** End of Addendum ***
FINDINGS: Pharynx and larynx: Mild motion artifact at the larynx, but evidence
of mild laryngeal asymmetry on the right including asymmetric
enlargement of the right piriform sinus. No discrete laryngeal mass
or hyperenhancement.

Pharyngeal soft tissue contours are within normal limits. Negative
parapharyngeal and retropharyngeal spaces. Unremarkable visible
cervical esophagus.

Salivary glands: Sublingual space, submandibular glands and parotid
glands are within normal limits.

Thyroid: Diminutive or absent, no definite thyroid parenchyma
identified.

Lymph nodes: Subcentimeter but heterogeneous and conspicuous left
level 4 lymph nodes are best demonstrated on series 10, image 57.
Individually these are 8 mm short axis (about 16 mm long axis, with
central hypodensity.

However, no other cervical lymphadenopathy identified.

Vascular: Bulky bilateral cervical carotid calcified
atherosclerosis, including high-grade stenoses at the bilateral
distal ICA bulbs, radiographic string sign on the left (series 2,
image 47) and approaching a string sign on the right (image 44).
None the less, the major vascular structures in the neck and at the
skull base are patent.

Limited intracranial: Small 12 mm enhancing mass projecting into the
lower left posterior fossa abutting the cerebellum (series 2, image
18), corresponding to a finding described on the brain MRI last
month. However, see skeletal findings below.

Visualized orbits: Negative.

Mastoids and visualized paranasal sinuses: Visible paranasal sinuses
are clear. Tympanic cavities remain clear.

However, there is a left mastoid effusion associated with
destruction of the left skull base, see below.

Skeleton: Lytic lesions in the left lower clivus (series 4, image
15), left occipital condyle, inferior left mastoid bone C2 posterior
elements (series 4, image 38), lateral right scapula (series 4,
image 91).

Extraosseous extension of tumor from the C2 spinous process (series
2, image 39). Pathologic fracture of the lateral right scapula.
Indeterminate moderate compression fracture of T6. Mild T3 superior
endplate compression.

Upper chest: Emphysema. Small 7 mm lung nodule in the anterior right
lower lobe on series 8, image 48. Most of the lungs are not
included. Calcified aortic atherosclerosis. Subcentimeter but
indeterminate left AP window lymph node measuring 8 mm on series 2,
image 116.

Other: Cachexia.
IMPRESSION: 1. Positive for multifocal lytic osseous lesions: left skull base,
C2 vertebra, and right scapula.
In conjunction with left level IV lymphadenopathy (see #2), favor
metastatic disease undetermined primary (see #3).

Pathologic fracture right scapula. Indeterminate compression
fractures of T3 and T6.

2. Small but malignant appearing lymph nodes at the left level IV
station. These along with the left skull base destruction could
predispose to left vocal cord paralysis, although the asymmetry of
the motion degraded larynx appears more compatible with a right side
cord paralysis. No right side causative lesion is identified.

3. Emphysema (LOPCE-1CY.N) with a suspicious 7 mm right lower lobe
pulmonary nodule in the visible lungs. Small but suspicious left AP
window lymph node.

4. Severe calcified atherosclerosis of both ICAs, with Severe
bilateral ICA bulb stenoses. Aortic Atherosclerosis (LOPCE-9AC.C).

## 2023-12-25 NOTE — Telephone Encounter (Signed)
 Pt daughter phoned in wanting pt to have swallowing P.T. done in home. I called [facility] where I sent referral and other P.T. facilities and none do in home swallowing P.T. I did call daughter back to inform her of this.
